# Patient Record
Sex: Female | Born: 1975 | Race: White | Hispanic: No | Smoking: Never smoker
Health system: Southern US, Community
[De-identification: ages and names within clinical notes are randomized; demographics above are authoritative.]

## PROBLEM LIST (undated history)

## (undated) DIAGNOSIS — Z8619 Personal history of other infectious and parasitic diseases: Secondary | ICD-10-CM

## (undated) DIAGNOSIS — M722 Plantar fascial fibromatosis: Secondary | ICD-10-CM

## (undated) DIAGNOSIS — D1800 Hemangioma unspecified site: Secondary | ICD-10-CM

## (undated) HISTORY — DX: Hemangioma unspecified site: D18.00

## (undated) HISTORY — DX: Personal history of other infectious and parasitic diseases: Z86.19

## (undated) HISTORY — PX: OTHER SURGICAL HISTORY: SHX169

## (undated) HISTORY — DX: Plantar fascial fibromatosis: M72.2

---

## 2004-09-19 ENCOUNTER — Ambulatory Visit: Payer: Self-pay | Admitting: Internal Medicine

## 2004-10-11 ENCOUNTER — Ambulatory Visit: Payer: Self-pay

## 2005-05-17 ENCOUNTER — Other Ambulatory Visit: Admission: RE | Admit: 2005-05-17 | Discharge: 2005-05-17 | Payer: Self-pay | Admitting: *Deleted

## 2005-08-21 ENCOUNTER — Emergency Department (HOSPITAL_COMMUNITY): Admission: EM | Admit: 2005-08-21 | Discharge: 2005-08-21 | Payer: Self-pay | Admitting: *Deleted

## 2005-10-20 ENCOUNTER — Encounter: Admission: RE | Admit: 2005-10-20 | Discharge: 2006-01-18 | Payer: Self-pay | Admitting: Nurse Practitioner

## 2008-04-07 ENCOUNTER — Other Ambulatory Visit: Admission: RE | Admit: 2008-04-07 | Discharge: 2008-04-07 | Payer: Self-pay | Admitting: Family Medicine

## 2008-06-25 ENCOUNTER — Ambulatory Visit: Payer: Self-pay | Admitting: Gynecology

## 2008-07-09 ENCOUNTER — Ambulatory Visit: Payer: Self-pay | Admitting: Gynecology

## 2008-07-24 ENCOUNTER — Ambulatory Visit: Payer: Self-pay | Admitting: Gynecology

## 2008-07-30 ENCOUNTER — Ambulatory Visit: Payer: Self-pay | Admitting: Gynecology

## 2008-07-30 ENCOUNTER — Ambulatory Visit (HOSPITAL_BASED_OUTPATIENT_CLINIC_OR_DEPARTMENT_OTHER): Admission: RE | Admit: 2008-07-30 | Discharge: 2008-07-30 | Payer: Self-pay | Admitting: Gynecology

## 2008-07-30 ENCOUNTER — Encounter: Payer: Self-pay | Admitting: Gynecology

## 2008-08-04 ENCOUNTER — Ambulatory Visit: Payer: Self-pay | Admitting: Gynecology

## 2008-08-10 ENCOUNTER — Ambulatory Visit: Payer: Self-pay | Admitting: Gynecology

## 2008-11-05 ENCOUNTER — Ambulatory Visit: Payer: Self-pay | Admitting: Gynecology

## 2009-04-01 ENCOUNTER — Ambulatory Visit: Payer: Self-pay | Admitting: Gynecology

## 2009-05-06 ENCOUNTER — Ambulatory Visit: Payer: Self-pay | Admitting: Gynecology

## 2009-05-06 ENCOUNTER — Other Ambulatory Visit: Admission: RE | Admit: 2009-05-06 | Discharge: 2009-05-06 | Payer: Self-pay | Admitting: Gynecology

## 2009-05-06 ENCOUNTER — Encounter: Payer: Self-pay | Admitting: Gynecology

## 2009-11-04 ENCOUNTER — Ambulatory Visit: Payer: Self-pay | Admitting: Gynecology

## 2009-11-18 ENCOUNTER — Ambulatory Visit: Payer: Self-pay | Admitting: Gynecology

## 2010-01-31 ENCOUNTER — Ambulatory Visit: Payer: Self-pay | Admitting: Gynecology

## 2010-05-19 ENCOUNTER — Other Ambulatory Visit: Admission: RE | Admit: 2010-05-19 | Discharge: 2010-05-19 | Payer: Self-pay | Admitting: Gynecology

## 2010-05-19 ENCOUNTER — Ambulatory Visit: Payer: Self-pay | Admitting: Gynecology

## 2010-07-29 ENCOUNTER — Ambulatory Visit: Payer: Self-pay | Admitting: Gynecology

## 2011-02-28 NOTE — H&P (Signed)
NAME:  Laurie Duffy, Laurie Duffy              ACCOUNT NO.:  [id number]   MEDICAL RECORD NO.:  [id number]          PATIENT TYPE:  AMB   LOCATION:  [facility]                         FACILITY:  [facility]   PHYSICIAN:  Timothy P. Fontaine, M.D.DATE OF BIRTH:  10/14/1976   DATE OF ADMISSION:  DATE OF DISCHARGE:                              HISTORY & PHYSICAL   DATE OF SURGERY:  15 October [facility].   PREOPERATIVE DIAGNOSES:  1. Left-sided abdominopelvic pain.  2. Left-sided ovarian cyst suspicious for endometrioma.  3. Endometrial polyp.  4. Vaginal mass.   HISTORY OF PRESENT ILLNESS:  A 35 year old G0 initially presented having  had a cystic area noted in the vagina on routine exam.  The patient had  a 1-2 cm fleshy area at the vaginal fornix on the left of the cervix.  She underwent subsequent ultrasound which showed this area which looked  to be a cystic solid area but also had a 33 mm homogeneously cystic mass  on the left ovary.  She subsequently notes that she has been having left-  sided pain.  It comes and goes particularly  with moving and straining.  On serial ultrasound this cystic area on the ovary has persisted as well  as this vaginal mass.  She was noted also on the initial ultrasound to  have a thickened endometrium, and on follow-up sonohistogram was found  to have a clear endometrial polyp.  She is admitted at this time for  laparoscopy, assessment of the left ovarian cyst, rule out endometriosis  for her pelvic pain, hysteroscopy with removal of the polyp, D and C as  well as assessment of the left vaginal mass with attempts at drainage  and/or sampling possible excision.   PAST MEDICAL HISTORY:  Significant for asthma.   PAST SURGICAL HISTORY:  Cyst removed from her back.   CURRENT MEDICATIONS:  Advair, Proventil, and Claritin p.r.n.   ALLERGIES:  No medications.   REVIEW OF SYSTEMS:  Noncontributory.   FAMILY HISTORY:  Noncontributory.   SOCIAL HISTORY:   Noncontributory.   ADMISSION PHYSICAL EXAMINATION:  Afebrile, vital signs are stable.  HEENT:  Normal.  LUNGS:  Clear.  CARDIAC:  Regular rate.  No rubs, murmurs or gallops.  ABDOMEN:  Benign.  PELVIC:  External BUS, vagina with a fleshy 2 cm mass upper left vagina,  nontender.  No overlying mucosal changes.  Bimanual uterus normal size,  midline mobile, nontender.  Adnexa without gross masses or tenderness.   ASSESSMENT:  A 35 year old G0.  Three separate issues:   1. Left-sided pelvic pain with ultrasound showing a persistent      homogeneous 33 mm mass on the ovary suggestive of an endometrioma      for laparoscopy and removal.  2. Endometrial polyp for hysteroscopy removal, dilatation and      curettage.  3. Fleshy mass upper vagina, possible Gartner duct cyst or      developmental remnant for sampling, possible excision.    I reviewed the proposed surgery with the patient to include the  expected intraoperative postoperative courses, and I reviewed the  individual  each part of the procedure and the inherent issues with each  part of the procedures.  I reviewed laparoscopy to include trocar  placement, multiple port sites, insufflation, the use of sharp blunt  dissection, laser electrocautery, harmonic scalpel.  She understands  there are no guarantees as far as her left-sided pain.  She is concerned  that it might be non-gynecological.  She wanted to have a general  surgeon present.  I reviewed with her that really everything points  towards endometriosis.  I will assess for other pathology.  If I find  other pathology I will summon a general surgeon, but at this point I  think the working diagnosis is endometriosis.  If her pain would persist  or change after the procedure that we would pursue a more involved  evaluation that may include MRI, CTs or referrals to other  subspecialties and she understands and is comfortable with this  approach.  I discussed the acute  intraoperative, postoperative courses  and possibilities to remove the cyst, possible removal of the ovary and  addressing other pathology as encountered.  No guarantees as far as pain  relief were made.  She understands her pain may persist, worsen or  change following the procedure.  The risks to include infection,  prolonged antibiotics, abscess formation requiring opening and draining  of abscesses, incisional complications requiring opening and draining of  incisions, closure by secondary intention, long-term issues as far as  cosmetics, scar formation, and hernia formation were all reviewed with  her.  The risks of hemorrhage necessitating transfusion and the risks of  transfusion including transfusion reaction, hepatitis, human  immunodeficiency virus, mad cow disease, and other unknown entities were  reviewed with her.  The risks of inadvertent injury to internal organs  either during the hysteroscopic part with uterine perforation or  laparoscopic part including bowel, bladder, ureters, vessels, and nerves  necessitating major exploratory reparative surgeries, future reparative  surgeries, ostomy formation, bowel resection, bladder repair, ureteral  damage repair were all discussed, understood, and accepted.  I reviewed  the issues with the vaginal mass; that if this was the only abnormality  and she is asymptomatic this probably is a developmental remnant that we  probably would observe; that this is an area where bladder, ureters,  large blood vessels come together.  I will at least attempt aspiration,  possible small incision to sample this area, but I will not be overly  aggressive to excise it to avoid complications as again she is  asymptomatic at this point.  The patient also agrees with this approach,  and she understands that she may continue to have this area after the  surgery and accepts this possibility.  All this has been reviewed with  the patient on several  occasions as evidenced by my office note  June 25, 2008 and again July 09, 2008.  The patient's  questions as well as her family members' questions were all answered to  her satisfaction.  She is ready to proceed with surgery.      Timothy P. Fontaine, M.D.  Electronically Signed     TPF/MEDQ  D:  07/09/2008  T:  07/09/2008  Job:  [id number]

## 2011-02-28 NOTE — Op Note (Signed)
NAME:  Laurie Duffy, BRICKEL              ACCOUNT NO.:  [id number]   MEDICAL RECORD NO.:  [id number]          PATIENT TYPE:  AMB   LOCATION:  [facility]                         FACILITY:  [facility]   PHYSICIAN:  Timothy P. Fontaine, M.D.DATE OF BIRTH:  21-Apr-1976   DATE OF PROCEDURE:  07/30/2008  DATE OF DISCHARGE:                               OPERATIVE REPORT   PREOPERATIVE DIAGNOSES:  Left-sided pelvic pain, left ovarian cyst,  endometrial polyp, vaginal cystic mass.   POSTOPERATIVE DIAGNOSES:  Endometrioma, left ovary, pelvic adhesions,  posterior cul-de-sac obliteration, endometrial polyp, vaginal cyst.   PROCEDURE:  Laparoscopic left ovarian endometrioma excision, lysis of  adhesions, fulguration endometriosis.  Hysteroscopy with removal  endometrial polyp, dilation and curettage, biopsy and drainage of left  vaginal wall cyst.   SURGEON:  Timothy P. Fontaine, M.D.   ASSISTANTGaetano Hawthorne. Lily Peer, M.D.   ANESTHETIC:  General with 1% lidocaine paracervical block, 0.25%  Marcaine incisional injection.   COMPLICATIONS:  None.   ESTIMATED BLOOD LOSS:  Minimal.   SORBITOL DISCREPANCY:  Minimal.   SPECIMENS:  1. Opening cell washing.  2. Left ovarian endometrioma.  3. Right paratubal cyst with adhesion.  4. Endometrial polyp.  5. Endometrial curetting.  6. Vaginal wall cyst biopsy.   FINDINGS:  EUA, external BUS vagina with 2 cm lateral left fornix,  vaginal cyst, cervix grossly normal.  Bimanual, uterus normal size,  midline and mobile.  Adnexa without gross masses, laparoscopic anterior  cul-de-sac normal.  Posterior cul-de-sac obliterated with sigmoid  adherent to the vagina and lower uterine segment.  Right fallopian tube  normal length and caliber of fimbriated end with a adhesive band with  simple paratubal type cyst and band going across to the lower posterior  uterine segment excised in its entirety.  Ovary grossly normal with the  exception of a small powder burn capsular  area which was fulgurated free  and mobile.  No sidewall disease noted.  Left ovary grossly enlarged  with cystic mass ultimately excised in its entirety and entered with  endometriotic classic fluid.  Cyst wall excised in its entirety.  Left  fallopian tube normal length and caliber of fimbriated end.  Upper  abdominal exam, appendix grossly normal free and mobile.  Liver smooth,  no abnormalities.  Gallbladder grossly normal.  No upper abdominal  adhesive disease or overt pathology.  Hysteroscopic, classic endometrial  polyp from left posterior endometrial wall excised at its base.  Hysteroscopy was otherwise normal and adequate noting right and left  tubal ostia.  Fundus, anterior and posterior uterine surfaces, lower  uterine segment, endocervical canal all visualized   PROCEDURE:  The patient was taken to the operating room, underwent  general anesthesia was placed in low dorsal lithotomy position, received  abdominal perineal vaginal preparation with Betadine solution.  Bladder  emptied with indwelling Foley catheterization.  Draped in usual fashion.  EUA performed.  A Hulka tenaculum was placed in the cervix.  Subsequently, a vertical infraumbilical incision was made using the 10-  mm OptiView direct entry trocar.  The abdomen was directly entered  without difficulty and subsequently insufflated.  Right and left 5-mm  suprapubic ports were then placed under direct visualization after  transillumination for the vessels.  Examination of pelvic organs, upper  abdominal exam was carried out with findings noted above.  Opening cell  washing was then taken and sent to pathology.  Subsequently, the right  peritubular cyst with adhesive band was excised and sent to pathology.  A small classic brown powder burn type lesion on the right ovarian  capsule was then fulgurated.  Attention was then turned to the left  ovarian cystic area.  This was elevated and using the Harmonic scalpel   incision made overlying the cyst through the capsule and through sharp  and blunt dissection the cyst wall was extracted from the surrounding  ovarian stroma.  During the process frank endometriotic fluid extruded  from this cyst.  After removal of this cyst wall in its entirety the  pelvis was copiously irrigated.  Several bleeding areas were addressed  using bipolar cautery.  It was felt at this point that due to the thick  adhesions of the sigmoid to the posterior lower uterine segment, upper  vagina that it really served no purpose at this point to try to free  this area given that it would more than likely immediately re-form, as  well as risk entry into the colon.  Interceed was then placed over the  left adnexa.  The gas was slowly allowed to escape.  The adnexa  visualized at showing adequate hemostasis under low pressure situation.  The right and left 5-mm ports were then removed again, showing adequate  hemostasis and the infraumbilical port was backed out under direct  visualization showing adequate hemostasis.  No evidence of hernia  formation.  All skin incisions injected using 0.25% Marcaine.  Zero  Vicryl infraumbilical subcutaneous fascial stitch was placed and all  skin incisions closed using Dermabond skin adhesive.  Attention was then  turned to the hysteroscopic portion of the case.  The patient was placed  in the high dorsal lithotomy position.  Re-gloved, cervix visualized  with a speculum, grasped anterior lip with a single-tooth tenaculum and  a paracervical block using 1% lidocaine was placed a total of 10 mL.  The cervix was gently gradually dilated to admit the operative  hysteroscope.  Hysteroscopy was performed with findings noted above.  Using the right-angle resectoscope loop the polyp was excised at its  base at the junction with the surrounding endometrium then removed  intact without difficulty and avoiding the tubal ostia.  A gentle sharp  curettage was  then performed.  This specimen was sent separately to  pathology.  Re-hysteroscopy showed an empty cavity, good distention, no  evidence of perforation.  The tenaculum was then removed.  Attention was  then turned to the vaginal cyst.  This was bulging into the vagina.  It  appeared superficially to be a cystic mass and using a large colposcopic  biopsy instrument the vaginal mucosa overlying the attenuated area was  biopsied into this cystic area.  A biopsy of both the cyst wall and  overlying vagina was incorporated in the same biopsy and this was sent  to pathology.  There was a thick tenacious mucus that drained from the  biopsy site, draining the cyst in its entirety.  Silver nitrate was  applied to the vaginal biopsy mucosa to achieve ultimate hemostasis.  The instruments were then removed.  The patient was placed in the supine  position, awakened without difficulty and taken to  the recovery room in  good condition having tolerated the procedure well and receiving Toradol  intraoperatively.      Timothy P. Fontaine, M.D.  Electronically Signed     TPF/MEDQ  D:  07/30/2008  T:  07/30/2008  Job:  [id number]

## 2011-05-17 ENCOUNTER — Other Ambulatory Visit: Payer: Self-pay | Admitting: *Deleted

## 2011-05-17 MED ORDER — NORETHINDRONE ACET-ETHINYL EST 1-20 MG-MCG PO TABS: 1.0000 | ORAL_TABLET | Freq: Every day | ORAL | Status: DC

## 2011-06-01 ENCOUNTER — Other Ambulatory Visit (HOSPITAL_COMMUNITY)
Admission: RE | Admit: 2011-06-01 | Discharge: 2011-06-01 | Disposition: A | Discharge status: Home / Self Care | Payer: 59 | Source: Ambulatory Visit | Attending: Gynecology | Admitting: Gynecology

## 2011-06-01 ENCOUNTER — Ambulatory Visit (INDEPENDENT_AMBULATORY_CARE_PROVIDER_SITE_OTHER): Payer: 59 | Admitting: Gynecology

## 2011-06-01 ENCOUNTER — Encounter: Payer: Self-pay | Admitting: Gynecology

## 2011-06-01 VITALS — BP 116/72 | Ht 66.0 in | Wt 153.0 lb

## 2011-06-01 DIAGNOSIS — J45909 Unspecified asthma, uncomplicated: Secondary | ICD-10-CM | POA: Insufficient documentation

## 2011-06-01 DIAGNOSIS — Z131 Encounter for screening for diabetes mellitus: Secondary | ICD-10-CM

## 2011-06-01 DIAGNOSIS — Z01419 Encounter for gynecological examination (general) (routine) without abnormal findings: Secondary | ICD-10-CM | POA: Insufficient documentation

## 2011-06-01 DIAGNOSIS — Z3041 Encounter for surveillance of contraceptive pills: Secondary | ICD-10-CM

## 2011-06-01 DIAGNOSIS — R823 Hemoglobinuria: Secondary | ICD-10-CM

## 2011-06-01 DIAGNOSIS — Z13220 Encounter for screening for lipoid disorders: Secondary | ICD-10-CM

## 2011-06-01 DIAGNOSIS — N809 Endometriosis, unspecified: Secondary | ICD-10-CM | POA: Insufficient documentation

## 2011-06-01 MED ORDER — NORETHINDRONE ACET-ETHINYL EST 1-20 MG-MCG PO TABS: 1.0000 | ORAL_TABLET | Freq: Every day | ORAL | Status: DC

## 2011-06-01 NOTE — Progress Notes (Signed)
Laurie Duffy 31-Jul-1976 [id number]        35 y.o.  for annual exam.   Past medical history,surgical history, allergies, family history and social history were all reviewed and documented in the EPIC chart. ROS:  Was performed and pertinent positives and negatives are included in the history.  Exam: chaperone present Filed Vitals:   06/01/11 1457  BP: 116/72   General appearance  Normal Skin grossly normal with hemangioma on the right anterior chest wall right below the midclavicular line  Head/Neck normal with no cervical or supraclavicular adenopathy thyroid normal Lungs  clear Cardiac RR, without RMG Abdominal  soft, nontender, without masses, organomegaly or hernia Breasts  examined lying and sitting without masses, retractions, discharge or axillary adenopathy. Pelvic  Ext/BUS/vagina  normal   Cervix  normal  Pap done  Uterus  anteverted, normal size, shape and contour, midline and mobile nontender   Adnexa  Without masses or tenderness    Anus and perineum  normal   Rectovaginal  normal sphincter tone without palpated masses or tenderness.    Assessment/Plan:  35 y.o. female for annual exam.    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 She saw a dermatologist who recommended observation did not feel intervention would be warranted at this time. Patient will continue to monitor. #2 Pregnancy questions. Patient and husband are thinking about pregnancy and she asked me about this. I discussed with her the need to be off of birth control pills for a month before trying and the need for preconceptional multivitamin supplementation with folic acid. I discussed increased risk of pregnancy with advancing maternal age to include increased risk for metabolic complications such as hypertension diabetes, increased risk for chromosomal abnormalities as well as the decreased fecundity with advancing  maternal age.  She also has a history of endometriosis and possible infertility associated with this was discussed.  Patient and husband are just thinking about it she does want to continue on her birth control pills for now and I refilled her Junel 1/20 prescription for a year. Self breast exams on a monthly basis discussed urged. Screening mammographic recommendations between 35 and 40 were reviewed she has no strong family history prefers to wait closer to 40.    Dara Lords MD, 4:03 PM 06/01/2011

## 2012-06-03 ENCOUNTER — Ambulatory Visit (INDEPENDENT_AMBULATORY_CARE_PROVIDER_SITE_OTHER): Payer: 59 | Admitting: Gynecology

## 2012-06-03 ENCOUNTER — Encounter: Payer: Self-pay | Admitting: Gynecology

## 2012-06-03 VITALS — BP 110/66 | Ht 66.0 in | Wt 158.0 lb

## 2012-06-03 DIAGNOSIS — Z01419 Encounter for gynecological examination (general) (routine) without abnormal findings: Secondary | ICD-10-CM

## 2012-06-03 DIAGNOSIS — Z8619 Personal history of other infectious and parasitic diseases: Secondary | ICD-10-CM | POA: Insufficient documentation

## 2012-06-03 NOTE — Progress Notes (Signed)
Laurie Duffy [birth year] [id number]        36 y.o.  G0P0 for annual exam.  Doing well, planning pregnancy.  Past medical history,surgical history, medications, allergies, family history and social history were all reviewed and documented in the EPIC chart. ROS:  Was performed and pertinent positives and negatives are included in the history.  Exam: Kim assistant Filed Vitals:   06/03/12 0849  BP: 110/66  Height: 5\' 6"  (1.676 m)  Weight: 158 lb (71.668 kg)   General appearance  Normal Skin grossly normal Head/Neck normal with no cervical or supraclavicular adenopathy thyroid normal Lungs  Clear. A 3 cm hemangioma right upper anterior chest wall. Cardiac RR, without RMG Abdominal  soft, nontender, without masses, organomegaly or hernia Breasts  examined lying and sitting without masses, retractions, discharge or axillary adenopathy. Pelvic  Ext/BUS/vagina  normal   Cervix  normal   Uterus  retroverted, normal size, shape and contour, midline and mobile nontender   Adnexa  Without masses or tenderness    Anus and perineum  normal   Rectovaginal  normal sphincter tone without palpated masses or tenderness.    Assessment/Plan:  36 y.o. G0P0 female for annual exam.   1. Planning pregnancy now. On a prenatal vitamin. Discussed optimal time to achieve pregnancy. Recommended stop using facial cream as the possibility of vitamin A content. Reviewed increased risk of chromosomal abnormalities associated with advancing maternal age and the availability of prenatal diagnostic techniques to include serum, ultrasound, CVS, amniocentesis.  Patient understands obstetrical group will further discuss with her. She understands that we do not do obstetrics and once she achieves pregnancy she'll need to be followed by and obstetrical group in [place]. I reviewed various possibilities with her and she is going to make her decision.  Options to follow up here once she achieves pregnancy for viability  confirmation before following up with the obstetrical group or going directly to them reviewed. 2. Hemangioma right upper chest wall. Stable over years observation. I reviewed possibility of enlargement during pregnancy and the obstetricians we'll need to follow this with her. 3. Pap smear. Last Pap smear 2011. No Pap smear done today. Anticipate will be done at her new OB appointment. 4. Lab work. No blood work was done today as she had a normal glucose lipid profile and CBC 2011. She will have lab work done at her new OB appointment. 5. Breast health. SBE monthly reviewed.   Dara Lords MD, 9:19 AM 06/03/2012

## 2012-06-03 NOTE — Patient Instructions (Signed)
Follow up when you achieve pregnancy for viability verification. Research obstetrical groups in [place] as you will need to make an appointment for continuing obstetrical care

## 2012-06-04 ENCOUNTER — Encounter: Payer: Self-pay | Admitting: Gynecology

## 2012-06-04 LAB — URINALYSIS W MICROSCOPIC + REFLEX CULTURE
Bilirubin Urine: NEGATIVE
Crystals: NONE SEEN
Glucose, UA: NEGATIVE mg/dL
Leukocytes, UA: NEGATIVE
Protein, ur: NEGATIVE mg/dL
Specific Gravity, Urine: 1.009 (ref 1.005–1.030)
Squamous Epithelial / LPF: NONE SEEN
Urobilinogen, UA: 0.2 mg/dL (ref 0.0–1.0)

## 2012-07-05 ENCOUNTER — Telehealth: Payer: Self-pay | Admitting: *Deleted

## 2012-07-05 DIAGNOSIS — O3680X0 Pregnancy with inconclusive fetal viability, not applicable or unspecified: Secondary | ICD-10-CM

## 2012-07-05 NOTE — Telephone Encounter (Signed)
Pt informed and wants an [place] so will schedule KW

## 2012-07-05 NOTE — Telephone Encounter (Signed)
Ok to go directly to [facility] group.  Only reason to f/u here would be if she wants ultrasound to confirm viability before seeing another group.  Some patients prefer if early miscarriage, to be taken care of by [place] before seeing a group she has never met before.  If she does want to see [place] first, she can schedule sono outright and see me same day.

## 2012-07-05 NOTE — Telephone Encounter (Signed)
Pt had a positive preg test and confirmed by PCP. She has an apt scheduled with you Tuesday 9/24. Since confirmed by another dr she doesn't need to come here right? Do you need her to make another apt or just give names of OB drs? PLease advise?

## 2012-07-09 ENCOUNTER — Ambulatory Visit (INDEPENDENT_AMBULATORY_CARE_PROVIDER_SITE_OTHER): Payer: 59 | Admitting: Gynecology

## 2012-07-09 ENCOUNTER — Encounter: Payer: Self-pay | Admitting: Gynecology

## 2012-07-09 ENCOUNTER — Ambulatory Visit (INDEPENDENT_AMBULATORY_CARE_PROVIDER_SITE_OTHER): Payer: 59

## 2012-07-09 ENCOUNTER — Ambulatory Visit: Payer: 59 | Admitting: Gynecology

## 2012-07-09 DIAGNOSIS — O3680X0 Pregnancy with inconclusive fetal viability, not applicable or unspecified: Secondary | ICD-10-CM

## 2012-07-09 DIAGNOSIS — N912 Amenorrhea, unspecified: Secondary | ICD-10-CM

## 2012-07-09 DIAGNOSIS — O09519 Supervision of elderly primigravida, unspecified trimester: Secondary | ICD-10-CM

## 2012-07-09 LAB — US OB TRANSVAGINAL

## 2012-07-09 NOTE — Progress Notes (Signed)
The patient presents for ultrasound viability check. Doing well without bleeding or cramping.  Ultrasound shows viable IUP at 7 weeks 3 days consistent with her LMP.  Assessment and plan: Pregnancy at 7 weeks. Patient will make an appointment with an obstetrician in town for continued obstetrical care. She is on prenatal vitamin and will continue on this. General prenatal questions answered. Recommended that she be seen for her new OB appointment within the next 2-3 weeks to allow for discussion of prenatal testing options.

## 2012-07-09 NOTE — Patient Instructions (Signed)
Make a new OB appointment with an obstetrician in town within the next 2-3 weeks.

## 2012-07-22 ENCOUNTER — Encounter: Payer: Self-pay | Admitting: Gynecology

## 2012-07-22 ENCOUNTER — Ambulatory Visit (INDEPENDENT_AMBULATORY_CARE_PROVIDER_SITE_OTHER): Payer: 59

## 2012-07-22 ENCOUNTER — Ambulatory Visit (HOSPITAL_COMMUNITY)
Admit: 2012-07-22 | Discharge: 2012-07-22 | Disposition: A | Discharge status: Home / Self Care | Payer: 59 | Source: Ambulatory Visit | Attending: Gynecology | Admitting: Gynecology

## 2012-07-22 ENCOUNTER — Ambulatory Visit (INDEPENDENT_AMBULATORY_CARE_PROVIDER_SITE_OTHER): Payer: 59 | Admitting: Gynecology

## 2012-07-22 ENCOUNTER — Encounter (HOSPITAL_BASED_OUTPATIENT_CLINIC_OR_DEPARTMENT_OTHER): Payer: Self-pay | Admitting: *Deleted

## 2012-07-22 VITALS — Temp 98.3°F

## 2012-07-22 DIAGNOSIS — O200 Threatened abortion: Secondary | ICD-10-CM

## 2012-07-22 DIAGNOSIS — O021 Missed abortion: Secondary | ICD-10-CM

## 2012-07-22 LAB — CBC WITH DIFFERENTIAL/PLATELET
Hemoglobin: 13.6 g/dL (ref 12.0–15.0)
Lymphocytes Relative: 30 % (ref 12–46)
Lymphs Abs: 1.7 10*3/uL (ref 0.7–4.0)
Monocytes Relative: 11 % (ref 3–12)
Neutro Abs: 3.2 10*3/uL (ref 1.7–7.7)
Neutrophils Relative %: 57 % (ref 43–77)
Platelets: 241 10*3/uL (ref 150–400)
RBC: 4.2 MIL/uL (ref 3.87–5.11)
WBC: 5.6 10*3/uL (ref 4.0–10.5)

## 2012-07-22 LAB — US OB TRANSVAGINAL

## 2012-07-22 LAB — ABO AND RH: Rh Type: POSITIVE

## 2012-07-22 NOTE — Addendum Note (Signed)
Addended by: Dara Lords on: 07/22/2012 02:23 PM   Modules accepted: Orders

## 2012-07-22 NOTE — Progress Notes (Addendum)
Patient presents at 9 weeks with spotting. She awoke this morning feeling a little warm although did not take her temperature. No coughing sneezing congestion or other viral URI symptoms. No nausea vomiting diarrhea constipation. Mild cramping. Previous ultrasound 07/05/2012 should viable IUP.  Exam with Selena Batten assistant HEENT normal without evidence of adenopathy or pharyngitis. Lungs clear Cardiac regular rate no rubs murmurs or gallops Abdomen soft nontender without masses guarding rebound organomegaly Pelvic external BUS vagina with dark brown spotting. Cervix long enclosed. Uterus 9 weeks soft midline mobile nontender.  Assessment and plan: 1. Threatened AB. Check ultrasound now. Patient does have an appointment to see her obstetrician is coming week. If viability confirmed and follow up with obstetrician with ASAP call precautions reviewed. If otherwise then we'll discuss following ultrasound. 2. Felt warm. Temperature today 98.3. No evidence of viral syndrome. Will monitor at present. Reviewed acceptable medicines to use early in pregnancy if symptoms develop such as Tylenol/Robitussin.  Addendum: Ultrasound subsequently shows missed AB with embryonic pole 8 weeks 3 days approximately one week behind dates with no cardiac motion. Reviewed situation with the patient and her husband. Options for management to include observation, Cytotec and suction D&C reviewed. The pros/cons, risks/benefits of each choice discussed. I reviewed what is involved with suction D&C to include the expected intraoperative/postoperative courses. The risks of bleeding, transfusion, infection, adverse effect on fertility, perforation with damage to internal organs including bowel bladder ureters vessels and nerves necessitating major exploratory reparative surgeries all reviewed with her. Expectant management/cytotec risks to include hemorrhage/infection/incomplete AB ultimately necessitating D&C regardless all reviewed.  Patient wants to proceed with suction D&C and will schedule this. We'll check CBC and type and Rh.

## 2012-07-22 NOTE — Addendum Note (Signed)
Addended by: Dara Lords on: 07/22/2012 02:32 PM   Modules accepted: Orders

## 2012-07-22 NOTE — Progress Notes (Signed)
NPO AFTER MN WITH EXCEPTION WATER/ GATORADE UNTIL 0700. ARRIVES AT 1145. LABS DONE AT OFFICE, RESULTS IN EPIC. PRE-OP ORDERS PENDING.

## 2012-07-22 NOTE — H&P (Signed)
  TYANA BUTZER 08/07/1976 [id number]   History and Physical  Chief complaint: missed abortion, 8-9 weeks  History of present illness: 36 y.o. G1P0 presents at 8-9 weeks complaining of spotting and mild pelvic cramping. Patient has previous ultrasound showing viable IUP with cardiac activity with follow up ultrasound showing no cardiac activity consistent with missed AB. Patient is admitted for suction D&C.  Past medical history,surgical history, medications, allergies, family history and social history were all reviewed and documented in the EPIC chart. ROS:  Was performed and pertinent positives and negatives are included in the history of present illness.  Exam:  Kim assistant General: well developed, well nourished female, no acute distress HEENT: normal  Lungs: clear to auscultation without wheezing, rales or rhonchi  Cardiac: regular rate without rubs, murmurs or gallops  Abdomen: soft, nontender without masses, guarding, rebound, organomegaly  Pelvic: external bus vagina: normal with mild brown staining Cervix: long and closed Uterus: 8 weeks' soft nontender midline mobile  Adnexa: without masses or tenderness      Assessment/Plan:  36 y.o. G1P0 at 8-9 weeks with missed abortion.  Options for management to include observation, Cytotec and suction D&C reviewed. The pros/cons, risks/benefits of each choice discussed. I reviewed what is involved with suction D&C to include the expected intraoperative/postoperative courses. The risks of bleeding, transfusion, infection, adverse effect on fertility, perforation with damage to internal organs including bowel bladder ureters vessels and nerves necessitating major exploratory reparative surgeries all reviewed with her. Expectant management/cytotec risks to include hemorrhage/infection/incomplete AB ultimately necessitating D&C regardless all reviewed. Patient wants to proceed with suction D&C and will schedule this. We'll check CBC and type  and Rh.      Dara Lords MD, 5:35 PM 07/22/2012

## 2012-07-22 NOTE — Patient Instructions (Addendum)
Follow up for ultrasound as scheduled. Follow up for surgery as scheduled.

## 2012-07-23 ENCOUNTER — Encounter (HOSPITAL_BASED_OUTPATIENT_CLINIC_OR_DEPARTMENT_OTHER): Payer: Self-pay | Admitting: Anesthesiology

## 2012-07-23 ENCOUNTER — Encounter (HOSPITAL_BASED_OUTPATIENT_CLINIC_OR_DEPARTMENT_OTHER)
Admission: RE | Disposition: A | Discharge status: Home / Self Care | Payer: Self-pay | Source: Ambulatory Visit | Attending: Gynecology

## 2012-07-23 ENCOUNTER — Ambulatory Visit (HOSPITAL_BASED_OUTPATIENT_CLINIC_OR_DEPARTMENT_OTHER): Payer: 59 | Admitting: Anesthesiology

## 2012-07-23 ENCOUNTER — Ambulatory Visit (HOSPITAL_BASED_OUTPATIENT_CLINIC_OR_DEPARTMENT_OTHER)
Admission: RE | Admit: 2012-07-23 | Discharge: 2012-07-23 | Disposition: A | Discharge status: Home / Self Care | Payer: 59 | Source: Ambulatory Visit | Attending: Gynecology | Admitting: Gynecology

## 2012-07-23 ENCOUNTER — Encounter (HOSPITAL_BASED_OUTPATIENT_CLINIC_OR_DEPARTMENT_OTHER): Payer: Self-pay | Admitting: *Deleted

## 2012-07-23 DIAGNOSIS — O021 Missed abortion: Secondary | ICD-10-CM | POA: Insufficient documentation

## 2012-07-23 HISTORY — PX: DILATION AND EVACUATION: SHX1459

## 2012-07-23 SURGERY — DILATION AND EVACUATION, UTERUS
Anesthesia: General | Site: Uterus | Wound class: Clean Contaminated

## 2012-07-23 MED ORDER — PROPOFOL 10 MG/ML IV BOLUS
INTRAVENOUS | Status: DC | PRN
  Administered 2012-07-23: 170 mg via INTRAVENOUS

## 2012-07-23 MED ORDER — PROMETHAZINE HCL 25 MG/ML IJ SOLN: 6.2500 mg | INTRAMUSCULAR | Status: DC | PRN

## 2012-07-23 MED ORDER — LIDOCAINE HCL (CARDIAC) 20 MG/ML IV SOLN
INTRAVENOUS | Status: DC | PRN
  Administered 2012-07-23: 50 mg via INTRAVENOUS

## 2012-07-23 MED ORDER — DEXTROSE 5 % IV SOLN
2.0000 g | INTRAVENOUS | Status: AC
  Administered 2012-07-23: 1 g via INTRAVENOUS

## 2012-07-23 MED ORDER — LACTATED RINGERS IV SOLN: INTRAVENOUS | Status: DC

## 2012-07-23 MED ORDER — KETOROLAC TROMETHAMINE 30 MG/ML IJ SOLN
INTRAMUSCULAR | Status: DC | PRN
  Administered 2012-07-23: 30 mg via INTRAVENOUS

## 2012-07-23 MED ORDER — ALPRAZOLAM 0.5 MG PO TABS: 0.5000 mg | ORAL_TABLET | Freq: Every evening | ORAL | Status: DC | PRN

## 2012-07-23 MED ORDER — FENTANYL CITRATE 0.05 MG/ML IJ SOLN
INTRAMUSCULAR | Status: DC | PRN
  Administered 2012-07-23 (×2): 50 ug via INTRAVENOUS

## 2012-07-23 MED ORDER — MEPERIDINE HCL 25 MG/ML IJ SOLN: 6.2500 mg | INTRAMUSCULAR | Status: DC | PRN

## 2012-07-23 MED ORDER — FENTANYL CITRATE 0.05 MG/ML IJ SOLN: 25.0000 ug | INTRAMUSCULAR | Status: DC | PRN

## 2012-07-23 MED ORDER — DEXTROSE-NACL 5-0.9 % IV SOLN: INTRAVENOUS | Status: DC

## 2012-07-23 MED ORDER — ONDANSETRON HCL 4 MG/2ML IJ SOLN
INTRAMUSCULAR | Status: DC | PRN
  Administered 2012-07-23: 4 mg via INTRAVENOUS

## 2012-07-23 MED ORDER — LACTATED RINGERS IV SOLN
INTRAVENOUS | Status: DC
  Administered 2012-07-23: 12:00:00 via INTRAVENOUS

## 2012-07-23 MED ORDER — MIDAZOLAM HCL 5 MG/5ML IJ SOLN
INTRAMUSCULAR | Status: DC | PRN
  Administered 2012-07-23: 2 mg via INTRAVENOUS

## 2012-07-23 MED ORDER — DEXAMETHASONE SODIUM PHOSPHATE 4 MG/ML IJ SOLN
INTRAMUSCULAR | Status: DC | PRN
  Administered 2012-07-23: 10 mg via INTRAVENOUS

## 2012-07-23 MED ORDER — LIDOCAINE HCL 1 % IJ SOLN
INTRAMUSCULAR | Status: DC | PRN
  Administered 2012-07-23: 10 mL

## 2012-07-23 SURGICAL SUPPLY — 33 items
BAG URINE DRAINAGE (UROLOGICAL SUPPLIES) IMPLANT
CATH FOLEY 2WAY SLVR 30CC 16FR (CATHETERS) IMPLANT
CATH ROBINSON RED A/P 16FR (CATHETERS) ×2 IMPLANT
CLOTH BEACON ORANGE TIMEOUT ST (SAFETY) ×2 IMPLANT
COVER TABLE BACK 60X90 (DRAPES) ×2 IMPLANT
DRAPE LG THREE QUARTER DISP (DRAPES) ×2 IMPLANT
DRAPE UNDERBUTTOCKS STRL (DRAPE) ×2 IMPLANT
DRESSING TELFA 8X3 (GAUZE/BANDAGES/DRESSINGS) IMPLANT
GLOVE BIO SURGEON STRL SZ7.5 (GLOVE) ×4 IMPLANT
GOWN SURGICAL LARGE (GOWNS) ×1 IMPLANT
GOWN SURGICAL XLG (GOWNS) ×1 IMPLANT
GOWN W/2 COTTON TOWELS 2 STD (GOWNS) ×2 IMPLANT
KIT BERKELEY 1ST TRIMESTER 3/8 (MISCELLANEOUS) ×1 IMPLANT
LEGGING LITHOTOMY PAIR STRL (DRAPES) ×2 IMPLANT
NDL SPNL 22GX3.5 QUINCKE BK (NEEDLE) ×1 IMPLANT
NEEDLE SPNL 22GX3.5 QUINCKE BK (NEEDLE) ×2 IMPLANT
NS IRRIG 500ML POUR BTL (IV SOLUTION) ×1 IMPLANT
PACK BASIN DAY SURGERY FS (CUSTOM PROCEDURE TRAY) ×2 IMPLANT
PAD OB MATERNITY 4.3X12.25 (PERSONAL CARE ITEMS) ×2 IMPLANT
PAD PREP 24X48 CUFFED NSTRL (MISCELLANEOUS) ×2 IMPLANT
SCOPETTES 8  STERILE (MISCELLANEOUS)
SCOPETTES 8 STERILE (MISCELLANEOUS) IMPLANT
SET BERKELEY SUCTION TUBING (SUCTIONS) ×2 IMPLANT
SYR 30ML LL (SYRINGE) IMPLANT
SYR CONTROL 10ML LL (SYRINGE) ×1 IMPLANT
TOP DISP BERKELEY (MISCELLANEOUS) ×1 IMPLANT
TOWEL OR 17X24 6PK STRL BLUE (TOWEL DISPOSABLE) ×3 IMPLANT
TRAY DSU PREP LF (CUSTOM PROCEDURE TRAY) ×2 IMPLANT
VACURETTE 10 RIGID CVD (CANNULA) IMPLANT
VACURETTE 7MM CVD STRL WRAP (CANNULA) IMPLANT
VACURETTE 8 RIGID CVD (CANNULA) IMPLANT
VACURETTE 9 RIGID CVD (CANNULA) ×1 IMPLANT
WATER STERILE IRR 500ML POUR (IV SOLUTION) ×1 IMPLANT

## 2012-07-23 NOTE — H&P (Signed)
  The patient was examined.  I reviewed the proposed surgery and consent form with the patient.  The dictated history and physical is current and accurate and all questions were answered. The patient is ready to proceed with surgery and has a realistic understanding and expectation for the outcome.   Dara Lords MD, 1:00 PM 07/23/2012

## 2012-07-23 NOTE — Anesthesia Preprocedure Evaluation (Signed)
Anesthesia Evaluation  Patient identified by MRN, date of birth, ID band Patient awake    Reviewed: Allergy & Precautions, H&P , NPO status , Patient's Chart, lab work & pertinent test results  Airway Mallampati: II TM Distance: >3 FB Neck ROM: Full    Dental No notable dental hx.    Pulmonary neg pulmonary ROS, asthma ,  breath sounds clear to auscultation  Pulmonary exam normal       Cardiovascular negative cardio ROS  Rhythm:Regular Rate:Normal     Neuro/Psych negative neurological ROS  negative psych ROS   GI/Hepatic negative GI ROS, Neg liver ROS,   Endo/Other  negative endocrine ROS  Renal/GU negative Renal ROS  negative genitourinary   Musculoskeletal negative musculoskeletal ROS (+)   Abdominal   Peds negative pediatric ROS (+)  Hematology negative hematology ROS (+)   Anesthesia Other Findings   Reproductive/Obstetrics negative OB ROS                           Anesthesia Physical Anesthesia Plan  ASA: II  Anesthesia Plan: General   Post-op Pain Management:    Induction: Intravenous  Airway Management Planned: LMA  Additional Equipment:   Intra-op Plan:   Post-operative Plan: Extubation in OR  Informed Consent: I have reviewed the patients History and Physical, chart, labs and discussed the procedure including the risks, benefits and alternatives for the proposed anesthesia with the patient or authorized representative who has indicated his/her understanding and acceptance.   Dental advisory given  Plan Discussed with: CRNA  Anesthesia Plan Comments:         Anesthesia Quick Evaluation  

## 2012-07-23 NOTE — Anesthesia Procedure Notes (Addendum)
Procedure Name: LMA Insertion Date/Time: 07/23/2012 1:09 PM Performed by: Maris Berger T Pre-anesthesia Checklist: Patient identified, Emergency Drugs available, Suction available and Patient being monitored Patient Re-evaluated:Patient Re-evaluated prior to inductionOxygen Delivery Method: Circle System Utilized Preoxygenation: Pre-oxygenation with 100% oxygen Intubation Type: IV induction Ventilation: Mask ventilation without difficulty LMA: LMA inserted LMA Size: 4.0 Number of attempts: 1 Placement Confirmation: positive ETCO2 Dental Injury: Teeth and Oropharynx as per pre-operative assessment  Comments: Gauze roll between teeth

## 2012-07-23 NOTE — Op Note (Signed)
Laurie Duffy 1976-05-06 [id number]   Post Operative Note   Date of surgery:  07/23/2012  Pre Op Dx:  Missed abortion, 8 weeks'  Post Op Dx:  same  Procedure:  Suction D&C  Surgeon:  Dara Lords  Anesthesia:  General  EBL:  minimal  Complications:  None  Specimen:  POC to pathology  Findings: EUA:  External BUS vagina normal. Cervix closed with bloody mucoid discharge.  Uterus 8 weeks size midline soft. Adnexa without masses  Procedure:  The patient was taken to the operating room, underwent general anesthesia, was placed in a low dorsal lithotomy position, received a vaginal/perineal preparation with Betadine solution, bladder emptied with an in and out Foley catheterization and EUA was performed. The time out was performed by the surgical team. The cervix was visualized with a speculum, anterior lip grasped with a single-tooth tenaculum and a paracervical block using 1% lidocaine was placed a total of 10 cc. The cervix was gently and gradually dilated to admit the #9 suction curet and the suction curettage was performed with gross POC retrieved. A gentle sharp curettage was performed to assure complete emptying. The tenaculum was removed with adequate hemostasis visualized at the tenaculum site and external os. The patient received intraoperative Toradol, was awakened without difficulty and taken to the recovery room in good condition having tolerated the procedure well. The patient's blood type is A+.    Dara Lords MD, 1:26 PM 07/23/2012

## 2012-07-23 NOTE — Transfer of Care (Signed)
Immediate Anesthesia Transfer of Care Note  Patient: Laurie Duffy  Procedure(s) Performed: Procedure(s) (LRB) with comments: DILATATION AND EVACUATION (N/A)  Patient Location: PACU  Anesthesia Type: General  Level of Consciousness: sedated  Airway & Oxygen Therapy: Patient Spontanous Breathing and Patient connected to nasal cannula oxygen  Post-op Assessment: Report given to PACU RN  Post vital signs: Reviewed and stable  Complications: No apparent anesthesia complications

## 2012-07-24 ENCOUNTER — Encounter (HOSPITAL_BASED_OUTPATIENT_CLINIC_OR_DEPARTMENT_OTHER): Payer: Self-pay | Admitting: Gynecology

## 2012-07-24 NOTE — Anesthesia Postprocedure Evaluation (Signed)
  Anesthesia Post-op Note  Patient: Laurie Duffy  Procedure(s) Performed: Procedure(s) (LRB): DILATATION AND EVACUATION (N/A)  Patient Location: PACU  Anesthesia Type: General  Level of Consciousness: awake and alert   Airway and Oxygen Therapy: Patient Spontanous Breathing  Post-op Pain: mild  Post-op Assessment: Post-op Vital signs reviewed, Patient's Cardiovascular Status Stable, Respiratory Function Stable, Patent Airway and No signs of Nausea or vomiting  Post-op Vital Signs: stable  Complications: No apparent anesthesia complications

## 2012-07-31 ENCOUNTER — Telehealth: Payer: Self-pay | Admitting: *Deleted

## 2012-07-31 NOTE — Telephone Encounter (Signed)
Pt informed with the below note and follow as directed.

## 2012-07-31 NOTE — Telephone Encounter (Signed)
Does not sound unusual. As long as she is afebrile and the pain overall is getting better then I would watch and follow up at her postop appointment as scheduled.

## 2012-07-31 NOTE — Telephone Encounter (Signed)
Pt had D&C on 07/23/12, she is having sharp left pelvic pain off and on at times. Feeling bloated at times as well, trouble sleeping at night, only getting about 4 hours of sleep a night. No issues with bleeding, just light pain, she is taking the ibuprofen given. Pt asked if normal for 1 week post -op? Please advise

## 2012-08-05 ENCOUNTER — Encounter: Payer: Self-pay | Admitting: Gynecology

## 2012-08-05 ENCOUNTER — Ambulatory Visit (INDEPENDENT_AMBULATORY_CARE_PROVIDER_SITE_OTHER): Payer: 59 | Admitting: Gynecology

## 2012-08-05 DIAGNOSIS — Z9889 Other specified postprocedural states: Secondary | ICD-10-CM

## 2012-08-05 DIAGNOSIS — M545 Low back pain: Secondary | ICD-10-CM

## 2012-08-05 LAB — URINALYSIS W MICROSCOPIC + REFLEX CULTURE
Bilirubin Urine: NEGATIVE
Casts: NONE SEEN
Glucose, UA: NEGATIVE mg/dL
Ketones, ur: NEGATIVE mg/dL
WBC, UA: NONE SEEN WBC/hpf (ref <3)
pH: 5.5 (ref 5.0–8.0)

## 2012-08-05 MED ORDER — ZOLPIDEM TARTRATE 10 MG PO TABS: 10.0000 mg | ORAL_TABLET | Freq: Every evening | ORAL | Status: DC | PRN

## 2012-08-05 NOTE — Patient Instructions (Signed)
Call if without menses by 8 weeks' postop. Continue on prenatal vitamins daily. Call if any issues or questions.

## 2012-08-05 NOTE — Progress Notes (Signed)
Patient presents postop status post suction D&C for missed AB 07/23/2012. Notes some diffuse lower pelvic discomfort. No fever chills nausea vomiting diarrhea constipation. Pathology confirmed POC. Bleeding scant to absent.  Exam with Sherrilyn Rist Asst. Spine straight without CVA tenderness. Abdomen soft nontender without masses guarding rebound organomegaly Pelvic external BUS vagina normal. Cervix normal. Uterus grossly normal midline mobile nontender. Adnexa without masses or tenderness.  Assessment and plan: Normal exam status post D&C first trimester missed AB doing well. We'll monitor and await first menses. Will call if without menses at 8 weeks postop. Continue on prenatal vitamins. Wait one or 2 cycles before attempting subsequent pregnancy.  I did prescribe Xanax as needed for anxiety previously but she is having problems sleeping I prescribed Ambien 10 mg #30 one by mouth each bedtime when necessary sleep no refill.

## 2012-08-06 ENCOUNTER — Telehealth: Payer: Self-pay | Admitting: *Deleted

## 2012-08-06 NOTE — Telephone Encounter (Signed)
Ok to write? 

## 2012-08-06 NOTE — Telephone Encounter (Signed)
Sent to [employer]. To write letter.

## 2012-08-06 NOTE — Telephone Encounter (Signed)
Post op D & C 07/23/12. Pt called requesting if another letter could be written for her to stay out of work until Friday 08/09/12. And return on Monday 08/12/12? You previously wrote for her to return to work today.Pt said she not in any pain, just would like to get some rest at home, states she is tired. Pt said she discuss this with you at her post op OV 08/05/12. Rarely getting sleep she did take the Ambien given and it slept good last night. Please advise

## 2012-08-07 ENCOUNTER — Encounter: Payer: Self-pay | Admitting: Gynecology

## 2012-08-07 NOTE — Telephone Encounter (Signed)
Letter faxed to employer at patient's request.

## 2013-07-13 ENCOUNTER — Emergency Department (HOSPITAL_BASED_OUTPATIENT_CLINIC_OR_DEPARTMENT_OTHER): Payer: No Typology Code available for payment source

## 2013-07-13 ENCOUNTER — Emergency Department (HOSPITAL_BASED_OUTPATIENT_CLINIC_OR_DEPARTMENT_OTHER)
Admission: EM | Admit: 2013-07-13 | Discharge: 2013-07-13 | Disposition: A | Discharge status: Home / Self Care | Payer: No Typology Code available for payment source | Attending: Emergency Medicine | Admitting: Emergency Medicine

## 2013-07-13 ENCOUNTER — Encounter (HOSPITAL_BASED_OUTPATIENT_CLINIC_OR_DEPARTMENT_OTHER): Payer: Self-pay | Admitting: Emergency Medicine

## 2013-07-13 DIAGNOSIS — S139XXA Sprain of joints and ligaments of unspecified parts of neck, initial encounter: Secondary | ICD-10-CM | POA: Insufficient documentation

## 2013-07-13 DIAGNOSIS — J45909 Unspecified asthma, uncomplicated: Secondary | ICD-10-CM | POA: Insufficient documentation

## 2013-07-13 DIAGNOSIS — Z8619 Personal history of other infectious and parasitic diseases: Secondary | ICD-10-CM | POA: Insufficient documentation

## 2013-07-13 DIAGNOSIS — Z8742 Personal history of other diseases of the female genital tract: Secondary | ICD-10-CM | POA: Insufficient documentation

## 2013-07-13 DIAGNOSIS — S161XXA Strain of muscle, fascia and tendon at neck level, initial encounter: Secondary | ICD-10-CM

## 2013-07-13 DIAGNOSIS — Z79899 Other long term (current) drug therapy: Secondary | ICD-10-CM | POA: Insufficient documentation

## 2013-07-13 DIAGNOSIS — Z862 Personal history of diseases of the blood and blood-forming organs and certain disorders involving the immune mechanism: Secondary | ICD-10-CM | POA: Insufficient documentation

## 2013-07-13 DIAGNOSIS — Y92410 Unspecified street and highway as the place of occurrence of the external cause: Secondary | ICD-10-CM | POA: Insufficient documentation

## 2013-07-13 DIAGNOSIS — Y9389 Activity, other specified: Secondary | ICD-10-CM | POA: Insufficient documentation

## 2013-07-13 MED ORDER — HYDROCODONE-ACETAMINOPHEN 5-325 MG PO TABS
2.0000 | ORAL_TABLET | ORAL | Status: DC | PRN
Start: 2013-07-13 — End: 2013-09-23
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
## 2013-07-13 NOTE — ED Notes (Signed)
MD back at bedside for disposition 

## 2013-07-13 NOTE — ED Notes (Signed)
Patients necklace was removed in radiology for cervical C-spine x-rays.  Pt was given necklace back,  in a pink denture cup before leaving radiology.
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
## 2013-07-13 NOTE — ED Notes (Signed)
MD at bedside. 

## 2013-07-13 NOTE — ED Notes (Signed)
Patient returned from xray.

## 2013-09-23 ENCOUNTER — Encounter: Payer: Self-pay | Admitting: Gynecology

## 2013-09-23 ENCOUNTER — Ambulatory Visit (INDEPENDENT_AMBULATORY_CARE_PROVIDER_SITE_OTHER): Payer: 59 | Admitting: Gynecology

## 2013-09-23 VITALS — BP 116/74 | Ht 65.0 in | Wt 165.0 lb

## 2013-09-23 DIAGNOSIS — N9089 Other specified noninflammatory disorders of vulva and perineum: Secondary | ICD-10-CM

## 2013-09-23 DIAGNOSIS — Z01419 Encounter for gynecological examination (general) (routine) without abnormal findings: Secondary | ICD-10-CM

## 2013-09-23 NOTE — Progress Notes (Signed)
Laurie Duffy 07-Nov-1975 [id number]        37 y.o.  G2P0010 for annual exam.  Doing well without complaints.  Past medical history,surgical history, problem list, medications, allergies, family history and social history were all reviewed and documented in the EPIC chart.  ROS:  Performed and pertinent positives and negatives are included in the history, assessment and plan .  Exam: Kim assistant Filed Vitals:   09/23/13 1559  BP: 116/74  Height: 5\' 5"  (1.651 m)  Weight: 165 lb (74.844 kg)   General appearance  Normal Skin grossly normal Head/Neck normal with no cervical or supraclavicular adenopathy thyroid normal Lungs  clear Cardiac RR, without RMG Abdominal  soft, nontender, without masses, organomegaly or hernia Breasts  examined lying and sitting without masses, retractions, discharge or axillary adenopathy. Pelvic  Ext/BUS/vagina  Normal with small benign appearing skin papule left lower labia majora  Cervix  Normal   Uterus  anteverted, normal size, shape and contour, midline and mobile nontender   Adnexa  Without masses or tenderness    Anus and perineum  Normal   Rectovaginal  Normal sphincter tone without palpated masses or tenderness.    Assessment/Plan:  37 y.o. G2P0010 female for annual exam, regular menses, condom contraception.   1. Contraception patient using condoms at present. Had miscarriage last year and husband is struggling with this emotionally is not ready to pursue pregnancy at this time. I reviewed the issues of advancing maternal age with decreased fecundity and increased risk of chromosomal abnormality/miscarriage as well as her issue with endometriosis and possible recurrence adversely affecting fertility. Discussed possible counseling for her husband but apparently he refuses. Patient states though that she thinks he is coming around. Recommend multivitamin with folic acid/prenatal vitamin preconceptionaly 2. Benign appearing skin papule left lower  vulva. Present for years historically unchanged. Patient will continue with observation. As long as it remains unchanged and she will follow as it does not bother her. If it enlarges or changes she knows to represent for excision. 3. Breast health. Screening mammographic recommendations between 35 and 40 reviewed. No strong family history of breast cancer. Patient prefers to wait closer to 40. SBE monthly reviewed. 4. Pap smear 2012. No Pap smear done today. No history of abnormal Pap smears previously. Plan repeat Pap smear next year a 3 year interval. 5. Health maintenance. No routine blood work done this is done through her primary physician's office. Followup one year, sooner as needed.   Note: This document was prepared with digital dictation and possible smart phrase technology. Any transcriptional errors that result from this process are unintentional.   Dara Lords MD, 4:53 PM 09/23/2013

## 2013-09-23 NOTE — Patient Instructions (Addendum)
Begin,.stay on a multivitamin with folate acid. Followup in one year, sooner as needed.

## 2013-09-24 LAB — URINALYSIS W MICROSCOPIC + REFLEX CULTURE
Glucose, UA: NEGATIVE mg/dL
Hgb urine dipstick: NEGATIVE
Leukocytes, UA: NEGATIVE
Nitrite: NEGATIVE
Protein, ur: NEGATIVE mg/dL

## 2014-08-17 ENCOUNTER — Encounter: Payer: Self-pay | Admitting: Gynecology

## 2014-09-25 ENCOUNTER — Encounter: Payer: 59 | Admitting: Gynecology
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
## 2014-11-11 ENCOUNTER — Encounter: Payer: 59 | Admitting: Gynecology

## 2014-12-24 ENCOUNTER — Ambulatory Visit (INDEPENDENT_AMBULATORY_CARE_PROVIDER_SITE_OTHER): Payer: BC Managed Care – PPO | Admitting: Gynecology

## 2014-12-24 ENCOUNTER — Encounter: Payer: Self-pay | Admitting: Gynecology

## 2014-12-24 ENCOUNTER — Other Ambulatory Visit (HOSPITAL_COMMUNITY)
Admission: RE | Admit: 2014-12-24 | Discharge: 2014-12-24 | Disposition: A | Discharge status: Home / Self Care | Payer: BC Managed Care – PPO | Source: Ambulatory Visit | Attending: Gynecology | Admitting: Gynecology

## 2014-12-24 VITALS — BP 120/72 | Ht 66.0 in | Wt 152.0 lb

## 2014-12-24 DIAGNOSIS — Z01419 Encounter for gynecological examination (general) (routine) without abnormal findings: Secondary | ICD-10-CM | POA: Diagnosis not present

## 2014-12-24 DIAGNOSIS — Z1151 Encounter for screening for human papillomavirus (HPV): Secondary | ICD-10-CM | POA: Insufficient documentation

## 2014-12-24 DIAGNOSIS — N926 Irregular menstruation, unspecified: Secondary | ICD-10-CM

## 2014-12-24 LAB — COMPREHENSIVE METABOLIC PANEL
ALK PHOS: 53 U/L (ref 39–117)
ALT: 12 U/L (ref 0–35)
AST: 13 U/L (ref 0–37)
Albumin: 4.6 g/dL (ref 3.5–5.2)
BUN: 18 mg/dL (ref 6–23)
CALCIUM: 9.2 mg/dL (ref 8.4–10.5)
CHLORIDE: 104 meq/L (ref 96–112)
CO2: 25 mEq/L (ref 19–32)
CREATININE: 0.74 mg/dL (ref 0.50–1.10)
Glucose, Bld: 85 mg/dL (ref 70–99)
POTASSIUM: 4.5 meq/L (ref 3.5–5.3)
SODIUM: 137 meq/L (ref 135–145)
Total Bilirubin: 0.5 mg/dL (ref 0.2–1.2)
Total Protein: 6.9 g/dL (ref 6.0–8.3)

## 2014-12-24 LAB — LIPID PANEL
CHOL/HDL RATIO: 2.1 ratio
CHOLESTEROL: 140 mg/dL (ref 0–200)
HDL: 68 mg/dL (ref >=46)
LDL Cholesterol: 62 mg/dL (ref 0–99)
TRIGLYCERIDES: 48 mg/dL (ref <150)
VLDL: 10 mg/dL (ref 0–40)

## 2014-12-24 NOTE — Addendum Note (Signed)
Addended by: Nelva Nay on: 12/24/2014 04:50 PM   Modules accepted: Orders

## 2014-12-24 NOTE — Patient Instructions (Signed)
Office will contact you to arrange appointment with Dr. Kerin Perna, infertility specialist  You may obtain a copy of any labs that were done today by logging onto MyChart as outlined in the instructions provided with your AVS (after visit summary). The office will not call with normal lab results but certainly if there are any significant abnormalities then we will contact you.   Health Maintenance, Female A healthy lifestyle and preventative care can promote health and wellness.  Maintain regular health, dental, and eye exams.  Eat a healthy diet. Foods like vegetables, fruits, whole grains, low-fat dairy products, and lean protein foods contain the nutrients you need without too many calories. Decrease your intake of foods high in solid fats, added sugars, and salt. Get information about a proper diet from your caregiver, if necessary.  Regular physical exercise is one of the most important things you can do for your health. Most adults should get at least 150 minutes of moderate-intensity exercise (any activity that increases your heart rate and causes you to sweat) each week. In addition, most adults need muscle-strengthening exercises on 2 or more days a week.   Maintain a healthy weight. The body mass index (BMI) is a screening tool to identify possible weight problems. It provides an estimate of body fat based on height and weight. Your caregiver can help determine your BMI, and can help you achieve or maintain a healthy weight. For adults 20 years and older:  A BMI below 18.5 is considered underweight.  A BMI of 18.5 to 24.9 is normal.  A BMI of 25 to 29.9 is considered overweight.  A BMI of 30 and above is considered obese.  Maintain normal blood lipids and cholesterol by exercising and minimizing your intake of saturated fat. Eat a balanced diet with plenty of fruits and vegetables. Blood tests for lipids and cholesterol should begin at age 57 and be repeated every 5 years. If your  lipid or cholesterol levels are high, you are over 50, or you are a high risk for heart disease, you may need your cholesterol levels checked more frequently.Ongoing high lipid and cholesterol levels should be treated with medicines if diet and exercise are not effective.  If you smoke, find out from your caregiver how to quit. If you do not use tobacco, do not start.  Lung cancer screening is recommended for adults aged 87 80 years who are at high risk for developing lung cancer because of a history of smoking. Yearly low-dose computed tomography (CT) is recommended for people who have at least a 30-pack-year history of smoking and are a current smoker or have quit within the past 15 years. A pack year of smoking is smoking an average of 1 pack of cigarettes a day for 1 year (for example: 1 pack a day for 30 years or 2 packs a day for 15 years). Yearly screening should continue until the smoker has stopped smoking for at least 15 years. Yearly screening should also be stopped for people who develop a health problem that would prevent them from having lung cancer treatment.  If you are pregnant, do not drink alcohol. If you are breastfeeding, be very cautious about drinking alcohol. If you are not pregnant and choose to drink alcohol, do not exceed 1 drink per day. One drink is considered to be 12 ounces (355 mL) of beer, 5 ounces (148 mL) of wine, or 1.5 ounces (44 mL) of liquor.  Avoid use of street drugs. Do not share needles  with anyone. Ask for help if you need support or instructions about stopping the use of drugs.  High blood pressure causes heart disease and increases the risk of stroke. Blood pressure should be checked at least every 1 to 2 years. Ongoing high blood pressure should be treated with medicines, if weight loss and exercise are not effective.  If you are 32 to 40 years old, ask your caregiver if you should take aspirin to prevent strokes.  Diabetes screening involves taking a  blood sample to check your fasting blood sugar level. This should be done once every 3 years, after age 82, if you are within normal weight and without risk factors for diabetes. Testing should be considered at a younger age or be carried out more frequently if you are overweight and have at least 1 risk factor for diabetes.  Breast cancer screening is essential preventative care for women. You should practice "breast self-awareness." This means understanding the normal appearance and feel of your breasts and may include breast self-examination. Any changes detected, no matter how small, should be reported to a caregiver. Women in their 74s and 30s should have a clinical breast exam (CBE) by a caregiver as part of a regular health exam every 1 to 3 years. After age 40, women should have a CBE every year. Starting at age 26, women should consider having a mammogram (breast X-ray) every year. Women who have a family history of breast cancer should talk to their caregiver about genetic screening. Women at a high risk of breast cancer should talk to their caregiver about having an MRI and a mammogram every year.  Breast cancer gene (BRCA)-related cancer risk assessment is recommended for women who have family members with BRCA-related cancers. BRCA-related cancers include breast, ovarian, tubal, and peritoneal cancers. Having family members with these cancers may be associated with an increased risk for harmful changes (mutations) in the breast cancer genes BRCA1 and BRCA2. Results of the assessment will determine the need for genetic counseling and BRCA1 and BRCA2 testing.  The Pap test is a screening test for cervical cancer. Women should have a Pap test starting at age 87. Between ages 67 and 36, Pap tests should be repeated every 2 years. Beginning at age 51, you should have a Pap test every 3 years as long as the past 3 Pap tests have been normal. If you had a hysterectomy for a problem that was not cancer or  a condition that could lead to cancer, then you no longer need Pap tests. If you are between ages 37 and 54, and you have had normal Pap tests going back 10 years, you no longer need Pap tests. If you have had past treatment for cervical cancer or a condition that could lead to cancer, you need Pap tests and screening for cancer for at least 20 years after your treatment. If Pap tests have been discontinued, risk factors (such as a new sexual partner) need to be reassessed to determine if screening should be resumed. Some women have medical problems that increase the chance of getting cervical cancer. In these cases, your caregiver may recommend more frequent screening and Pap tests.  The human papillomavirus (HPV) test is an additional test that may be used for cervical cancer screening. The HPV test looks for the virus that can cause the cell changes on the cervix. The cells collected during the Pap test can be tested for HPV. The HPV test could be used to screen women aged  30 years and older, and should be used in women of any age who have unclear Pap test results. After the age of 35, women should have HPV testing at the same frequency as a Pap test.  Colorectal cancer can be detected and often prevented. Most routine colorectal cancer screening begins at the age of 84 and continues through age 54. However, your caregiver may recommend screening at an earlier age if you have risk factors for colon cancer. On a yearly basis, your caregiver may provide home test kits to check for hidden blood in the stool. Use of a small camera at the end of a tube, to directly examine the colon (sigmoidoscopy or colonoscopy), can detect the earliest forms of colorectal cancer. Talk to your caregiver about this at age 42, when routine screening begins. Direct examination of the colon should be repeated every 5 to 10 years through age 39, unless early forms of pre-cancerous polyps or small growths are found.  Hepatitis C  blood testing is recommended for all people born from [birth year] through 1966 and any individual with known risks for hepatitis C.  Practice safe sex. Use condoms and avoid high-risk sexual practices to reduce the spread of sexually transmitted infections (STIs). Sexually active women aged 53 and younger should be checked for Chlamydia, which is a common sexually transmitted infection. Older women with new or multiple partners should also be tested for Chlamydia. Testing for other STIs is recommended if you are sexually active and at increased risk.  Osteoporosis is a disease in which the bones lose minerals and strength with aging. This can result in serious bone fractures. The risk of osteoporosis can be identified using a bone density scan. Women ages 13 and over and women at risk for fractures or osteoporosis should discuss screening with their caregivers. Ask your caregiver whether you should be taking a calcium supplement or vitamin D to reduce the rate of osteoporosis.  Menopause can be associated with physical symptoms and risks. Hormone replacement therapy is available to decrease symptoms and risks. You should talk to your caregiver about whether hormone replacement therapy is right for you.  Use sunscreen. Apply sunscreen liberally and repeatedly throughout the day. You should seek shade when your shadow is shorter than you. Protect yourself by wearing long sleeves, pants, a wide-brimmed hat, and sunglasses year round, whenever you are outdoors.  Notify your caregiver of new moles or changes in moles, especially if there is a change in shape or color. Also notify your caregiver if a mole is larger than the size of a pencil eraser.  Stay current with your immunizations. Document Released: 04/17/2011 Document Revised: 01/27/2013 Document Reviewed: 04/17/2011 [facility] Patient Information 2014 [place].

## 2014-12-24 NOTE — Progress Notes (Signed)
Laurie Duffy September 09, 1976 [id number]        39 y.o.  G2P0010 for annual exam.  Several issues noted below.  Past medical history,surgical history, problem list, medications, allergies, family history and social history were all reviewed and documented as reviewed in the EPIC chart.  ROS:  Performed with pertinent positives and negatives included in the history, assessment and plan.   Additional significant findings :  Right hand dropping objects 3 episodes  Exam: Kim assistant Filed Vitals:   12/24/14 1543  BP: 120/72  Height: 5\' 6"  (1.676 m)  Weight: 152 lb (68.947 kg)   General appearance:  Normal affect, orientation and appearance. Skin: Grossly normal HEENT: Without gross lesions.  No cervical or supraclavicular adenopathy. Thyroid normal.  Lungs:  Clear without wheezing, rales or rhonchi Cardiac: RR, without RMG Abdominal:  Soft, nontender, without masses, guarding, rebound, organomegaly or hernia Breasts:  Examined lying and sitting without masses, retractions, discharge or axillary adenopathy. Pelvic:  Ext/BUS/vagina with light menses flow.  Mild distortion left upper vaginal mucosa consistent with her prior history of vaginal cyst excision. Small benign-appearing skin papule left lower labia majora stable times years.  Cervix , normal. Pap/HPV  Uterus axial, normal size, shape and contour, midline and mobile nontender   Adnexa  Without masses or tenderness    Anus and perineum  Normal   Rectovaginal  Normal sphincter tone without palpated masses or tenderness.    Assessment/Plan:  39 y.o. G17P0010 female for annual exam with irregular menses, no contraception.   1. Irregular menses. Patient notes her menses occur every 2-4 weeks. No bleeding in between. 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 Check baseline labs to include [place] TSH prolactin and progesterone level.  Patient attempting pregnancy now having unprotected intercourse. Options for fertility to include laboratory stimulant such as Clomid, gonadotropins with insemination up to including in vitro reviewed. Given the total picture recommend follow up with reproductive endocrinologist to have this discussion and decided further testing is necessary such as HSG or repeat laparoscopy. Will facilitate an appointment with Dr. Kerin Perna and patient agrees with this.  I did review the issues of pregnancy in a 39 year old to include increased risk of chromosomal abnormalities in the availability of prenatal diagnostic techniques. 2. Pap smear 2012. Pap smear/HPV today. No history of significant abnormal Pap smears previously. 3. Screening mammography. Patient turning 39 this year. Recommended that she proceed with screening mammography sometime this year.  SBE monthly reviewed. 4. Patient requests baseline labs now. She is scheduling an appointment with her primary and will provide these to them. CBC comprehensive metabolic panel lipid profile ordered along with her above lab work. No urinalysis done given her bleeding at this point.     Anastasio Auerbach MD, 4:32 PM 12/24/2014

## 2014-12-25 LAB — FOLLICLE STIMULATING HORMONE: FSH: 8.1 m[IU]/mL

## 2014-12-25 LAB — CBC WITH DIFFERENTIAL/PLATELET
BASOS ABS: 0.1 10*3/uL (ref 0.0–0.1)
Basophils Relative: 1 % (ref 0–1)
EOS PCT: 2 % (ref 0–5)
Eosinophils Absolute: 0.1 10*3/uL (ref 0.0–0.7)
HEMATOCRIT: 40.3 % (ref 36.0–46.0)
HEMOGLOBIN: 13.3 g/dL (ref 12.0–15.0)
LYMPHS ABS: 2.6 10*3/uL (ref 0.7–4.0)
Lymphocytes Relative: 44 % (ref 12–46)
MCH: 31.8 pg (ref 26.0–34.0)
MCHC: 33 g/dL (ref 30.0–36.0)
MCV: 96.4 fL (ref 78.0–100.0)
MONO ABS: 0.5 10*3/uL (ref 0.1–1.0)
MONOS PCT: 9 % (ref 3–12)
MPV: 8.7 fL (ref 8.6–12.4)
NEUTROS PCT: 44 % (ref 43–77)
Neutro Abs: 2.6 10*3/uL (ref 1.7–7.7)
Platelets: 240 10*3/uL (ref 150–400)
RBC: 4.18 MIL/uL (ref 3.87–5.11)
RDW: 14 % (ref 11.5–15.5)
WBC: 6 10*3/uL (ref 4.0–10.5)

## 2014-12-25 LAB — PROLACTIN: Prolactin: 10 ng/mL

## 2014-12-25 LAB — PROGESTERONE: Progesterone: 7.6 ng/mL

## 2014-12-25 LAB — TSH: TSH: 2.036 u[IU]/mL (ref 0.350–4.500)

## 2014-12-28 LAB — CYTOLOGY - PAP

## 2015-01-18 ENCOUNTER — Other Ambulatory Visit: Payer: Self-pay | Admitting: Family Medicine

## 2015-01-18 ENCOUNTER — Ambulatory Visit
Admission: RE | Admit: 2015-01-18 | Discharge: 2015-01-18 | Disposition: A | Discharge status: Home / Self Care | Payer: BC Managed Care – PPO | Source: Ambulatory Visit | Attending: Family Medicine | Admitting: Family Medicine

## 2015-01-18 DIAGNOSIS — M25531 Pain in right wrist: Secondary | ICD-10-CM

## 2016-01-11 ENCOUNTER — Ambulatory Visit (INDEPENDENT_AMBULATORY_CARE_PROVIDER_SITE_OTHER): Payer: BC Managed Care – PPO | Admitting: Gynecology

## 2016-01-11 ENCOUNTER — Encounter: Payer: Self-pay | Admitting: Gynecology

## 2016-01-11 VITALS — BP 116/74 | Ht 66.0 in | Wt 162.0 lb

## 2016-01-11 DIAGNOSIS — Z01419 Encounter for gynecological examination (general) (routine) without abnormal findings: Secondary | ICD-10-CM | POA: Diagnosis not present

## 2016-01-11 NOTE — Patient Instructions (Signed)
Call to Schedule your mammogram  Facilities in Between: 1)  [facility]. [facility], [address] Phone: [phone] 2)  Dr. Bertrand at [facility]  [address] Phone: [phone]     Mammogram A mammogram is an X-ray test to find changes in a woman's breast. You should get a mammogram if:  You are 40 years of age or older  You have risk factors.   Your doctor recommends that you have one.  BEFORE THE TEST  Do not schedule the test the week before your period, especially if your breasts are sore during this time.  On the day of your mammogram:  Wash your breasts and armpits well. After washing, do not put on any deodorant or talcum powder on until after your test.   Eat and drink as you usually do.   Take your medicines as usual.   If you are diabetic and take insulin, make sure you:   Eat before coming for your test.   Take your insulin as usual.   If you cannot keep your appointment, call before the appointment to cancel. Schedule another appointment.  TEST  You will need to undress from the waist up. You will put on a hospital gown.   Your breast will be put on the mammogram machine, and it will press firmly on your breast with a piece of plastic called a compression paddle. This will make your breast flatter so that the machine can X-ray all parts of your breast.   Both breasts will be X-rayed. Each breast will be X-rayed from above and from the side. An X-ray might need to be taken again if the picture is not good enough.   The mammogram will last about 15 to 30 minutes.  AFTER THE TEST Finding out the results of your test Ask when your test results will be ready. Make sure you get your test results.  Document Released: 12/29/2008 Document Revised: 09/21/2011 Document Reviewed: 12/29/2008 [employer] Patient Information 2012 [employer].   

## 2016-01-11 NOTE — Progress Notes (Signed)
    Laurie Duffy 07/01/1976 [id number]        39 y.o.  G1P0010  for annual exam.  Doing well.  Past medical history,surgical history, problem list, medications, allergies, family history and social history were all reviewed and documented as reviewed in the EPIC chart.  ROS:  Performed with pertinent positives and negatives included in the history, assessment and plan.   Additional significant findings :  none   Exam: Leanne Lovely Vitals:   01/11/16 1555  BP: 116/74  Height: 5\' 6"  (1.676 m)  Weight: 162 lb (73.483 kg)   General appearance:  Normal affect, orientation and appearance. 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  No cervical or supraclavicular adenopathy. Thyroid normal.  Lungs:  Clear without wheezing, rales or rhonchi Cardiac: RR, without RMG Abdominal:  Soft, nontender, without masses, guarding, rebound, organomegaly or hernia Breasts:  Examined lying and sitting without masses, retractions, discharge or axillary adenopathy. Pelvic:  Ext/BUS/vagina normal  Cervix normal  Uterus anteverted, normal size, shape and contour, midline and mobile nontender   Adnexa without masses or tenderness    Anus and perineum normal   Rectovaginal normal sphincter tone without palpated masses or tenderness.    Assessment/Plan:  40 y.o. G81P0010 female for annual exam with regular menses, no contraception.   1. Contraception. Patient and husband are attempting pregnancy. Having regular monthly menses. Normal lab work last year. Had discussed referral to reproductive endocrinologist but they are not interested in this. The plans are to spontaneously try this year and up without pregnancy to pursue adoption. Will call me if they change her mind for referral to reproductive endocrinologist. Continue on multivitamins with folic acid daily. 2. Pap smear/HPV 12/2014 negative. Pap smear done today. No  history of significant abnormal Pap smears previously. 3. Mammography never. Patient plans to schedule this year at age 67. Names and numbers provided. SBE monthly reviewed. 4. Health maintenance. No routine lab work done as patient reports this done through her primary physician's office. Follow up 1 year, sooner as needed.   Anastasio Auerbach MD, 4:22 PM 01/11/2016

## 2016-09-06 ENCOUNTER — Other Ambulatory Visit: Payer: Self-pay | Admitting: Orthopedic Surgery

## 2016-09-06 DIAGNOSIS — M25531 Pain in right wrist: Secondary | ICD-10-CM

## 2016-09-26 ENCOUNTER — Inpatient Hospital Stay: Admission: RE | Admit: 2016-09-26 | Payer: BC Managed Care – PPO | Source: Ambulatory Visit

## 2016-09-26 ENCOUNTER — Other Ambulatory Visit: Payer: BC Managed Care – PPO

## 2016-10-27 ENCOUNTER — Encounter: Payer: Self-pay | Admitting: Gynecology

## 2016-10-30 ENCOUNTER — Telehealth: Payer: Self-pay | Admitting: *Deleted

## 2016-10-30 DIAGNOSIS — D1800 Hemangioma unspecified site: Secondary | ICD-10-CM

## 2016-10-30 NOTE — Telephone Encounter (Signed)
Schedule an appointment with vascular surgery reference hemangioma anterior chest wall  Referral faxed to Vascular and Vein specialist Rip Harbour will contact pt to schedule

## 2016-10-30 NOTE — Telephone Encounter (Signed)
Schedule an appointment with vascular surgery reference hemangioma anterior chest wall

## 2016-10-31 NOTE — Telephone Encounter (Signed)
Dr.Fontaine I contacted [facility] spoke with Jenny Reichmann (referral coordinator) was informed that patient will not be scheduled without imaging confirming the diagnosis. Please advise

## 2016-10-31 NOTE — Telephone Encounter (Signed)
If you could check with their office and ask them what they recommend as far as imaging before I refer her.

## 2016-11-03 ENCOUNTER — Encounter: Payer: Self-pay | Admitting: *Deleted

## 2016-11-03 NOTE — Telephone Encounter (Signed)
Order placed, pt scheduled on 11/20/16 @ 9:15am at [facility], patient will need to be NPO 4 hours prior to scan, will need to pick up oral contrast at least 1 day prior to scan at 1st floor radiology. [phone] scheduling number to call to reschedule if needed. This will be sent to patient in my chart message.   Thurmond Butts said once scheduled and results are back Jenny Reichmann will call and schedule patient at [facility].

## 2016-11-03 NOTE — Telephone Encounter (Signed)
I called and spoke with Nurse Rayna and she is going to check with provider to see what they recommend for imaging

## 2016-11-03 NOTE — Telephone Encounter (Signed)
I think that supportive good size and that she should be seen by the thoracic surgeons so that they can make a decision as to who should treat her. Okay for CT with contrast of chest reference right anterior chest wall hemangioma

## 2016-11-03 NOTE — Telephone Encounter (Signed)
I spoke with Thurmond Butts (nurse) at [facility] and she asked one of the surgeons and was told that if the area was big on the anterior chest wall patient will need to ct scan of chest with contrast, if the area is small in size then a dermatologist would handle this. Please advise

## 2016-11-09 ENCOUNTER — Telehealth: Payer: Self-pay

## 2016-11-15 NOTE — Telephone Encounter (Signed)
Dr.Fontaine sent me a staff message stating "Can we please make an appointment with the vascular surgery service to let them look at this and decide what they want to order. I know they did not want to see her first but can you explain that they will not let me order testing and I'm going to have a hard time explaining why I need this for another physician's use. I would really appreciate if they would triage as to where her hemangioma is best dealt with."  I called and spoke with Thurmond Butts (nurse) she spoke with Dr. Tharon Aquas Trigt pt scheduled on 11/22/16 at his office and he is willing to see the patient before CT scan imaging. Ryan asked me to cancel the CT scan chest ( done) and Dr. Kerby Less will order any imaging he feels is needed, the pt will keep appointment schedule on 11/22/16. Pt is aware of this .

## 2016-11-20 ENCOUNTER — Ambulatory Visit (HOSPITAL_COMMUNITY): Payer: BC Managed Care – PPO

## 2016-11-21 ENCOUNTER — Encounter: Payer: Self-pay | Admitting: Thoracic Surgery (Cardiothoracic Vascular Surgery)

## 2016-11-21 ENCOUNTER — Institutional Professional Consult (permissible substitution) (INDEPENDENT_AMBULATORY_CARE_PROVIDER_SITE_OTHER): Payer: Self-pay | Admitting: Thoracic Surgery (Cardiothoracic Vascular Surgery)

## 2016-11-21 VITALS — BP 118/75 | HR 76 | Resp 20 | Ht 66.0 in | Wt 163.0 lb

## 2016-11-21 DIAGNOSIS — D1809 Hemangioma of other sites: Secondary | ICD-10-CM

## 2016-11-21 DIAGNOSIS — D1800 Hemangioma unspecified site: Secondary | ICD-10-CM

## 2016-11-21 NOTE — Progress Notes (Signed)
      [address]       [place] [address]             [phone]      41 yo woman with a birthmark on the right upper breast. Has recently gotten a little larger.   Hemangioma of skin of right breast. Some palpable soft tissue under the area. Mobile with no evidence of attachment to the underlying chest wall.  This is not a thoracic surgical problem. While I do not think this is serious, this is not an area I have any experience with.   I advised her to see a Industrial/product designer to further evaluate.  She will take my advice under consideration, but does not want a referral at this time.   No charge for today's visit (copay refunded) as this was not a thoracic surgical issue  Revonda Standard. Roxan Hockey, MD [facility] [phone]

## 2016-11-22 ENCOUNTER — Encounter: Payer: BC Managed Care – PPO | Admitting: Cardiothoracic Surgery

## 2017-10-02 ENCOUNTER — Encounter: Payer: BC Managed Care – PPO | Admitting: Gynecology

## 2017-10-05 ENCOUNTER — Encounter: Payer: Self-pay | Admitting: Gynecology

## 2017-10-05 ENCOUNTER — Ambulatory Visit: Payer: BC Managed Care – PPO | Admitting: Gynecology

## 2017-10-05 VITALS — BP 116/76 | Ht 65.0 in | Wt 151.0 lb

## 2017-10-05 DIAGNOSIS — Z113 Encounter for screening for infections with a predominantly sexual mode of transmission: Secondary | ICD-10-CM

## 2017-10-05 DIAGNOSIS — Z01419 Encounter for gynecological examination (general) (routine) without abnormal findings: Secondary | ICD-10-CM | POA: Diagnosis not present

## 2017-10-05 NOTE — Progress Notes (Signed)
    Laurie Duffy [birth year] [id number]        41 y.o.  G1P0010 for annual gynecologic exam.    Past medical history,surgical history, problem list, medications, allergies, family history and social history were all reviewed and documented as reviewed in the EPIC chart.  ROS:  Performed with pertinent positives and negatives included in the history, assessment and plan.   Additional significant findings : None   Exam: Caryn Bee assistant Vitals:   10/05/17 1551  BP: 116/76  Weight: 151 lb (68.5 kg)  Height: 5\' 5"  (1.651 m)   Body mass index is 25.13 kg/m.  General appearance:  Normal affect, orientation and appearance. Skin: Grossly normal excepting hemangioma right anterior upper chest wall HEENT: Without gross lesions.  No cervical or supraclavicular adenopathy. Thyroid normal.  Lungs:  Clear without wheezing, rales or rhonchi Cardiac: RR, without RMG Abdominal:  Soft, nontender, without masses, guarding, rebound, organomegaly or hernia Breasts:  Examined lying and sitting without masses, retractions, discharge or axillary adenopathy. Pelvic:  Ext, BUS, Vagina: Normal  Cervix: Normal  Uterus: Anteverted, normal size, shape and contour, midline and mobile nontender   Adnexa: Without masses or tenderness    Anus and perineum: Normal   Rectovaginal: Normal sphincter tone without palpated masses or tenderness.    Assessment/Plan:  41 y.o. G103P0010 female for annual gynecologic exam with regular menses, abstinent contraception.   1. Abstinent contraception.  Patient and her husband have separated due to him having an affair.  We discussed STD screening and ultimately GC/chlamydia, hepatitis B, hepatitis C, HIV, RPR performed.  She is asymptomatic without vaginal discharge or other symptoms. 2. Mammography 08/2017.  Continue with annual mammography next year.  Breast exam normal today. 3. Pap smear/HPV 12/2014.  No Pap smear done today.  No history of abnormal Pap smears  previously.  Plan repeat Pap smear at 5-year interval per current screening guidelines. 4. Hemangioma right upper anterior chest wall.  I referred to thoracic surgeon earlier this year who told the patient he is not sure why she was there to see him this was not his area.  He dermatologist or Psychiatric nurse.  Given the size of the hemangioma and the potential for underlying involvement I do not think a routine plastic surgery follow-up or dermatology follow-up is necessarily appropriate.  I would think someone who is more experienced in hemangioma care would be better suited to see her.  We could arrange through one of the surrounding medical centers.  At this point though the patient does not want a referral and prefers to get through her separation and divorce and then deal with this later and she will call me whenever she wants to proceed with this referral. 5. Health maintenance.  No routine lab work done as patient does this elsewhere.  Follow-up 1 year, sooner as needed.   Anastasio Auerbach MD, 4:13 PM 10/05/2017

## 2017-10-05 NOTE — Patient Instructions (Signed)
Follow-up in 1 year for annual exam, sooner as needed. 

## 2017-10-06 LAB — C. TRACHOMATIS/N. GONORRHOEAE RNA
C. trachomatis RNA, TMA: NOT DETECTED
N. gonorrhoeae RNA, TMA: NOT DETECTED

## 2017-10-08 LAB — RPR: RPR: NONREACTIVE

## 2017-10-08 LAB — HEPATITIS C ANTIBODY
HEP C AB: NONREACTIVE
SIGNAL TO CUT-OFF: 0 (ref <1.00)

## 2017-10-08 LAB — HEPATITIS B SURFACE ANTIGEN: HEP B S AG: NONREACTIVE

## 2017-10-08 LAB — HIV ANTIBODY (ROUTINE TESTING W REFLEX): HIV 1&2 Ab, 4th Generation: NONREACTIVE

## 2017-10-10 ENCOUNTER — Encounter: Payer: Self-pay | Admitting: Gynecology

## 2018-10-15 ENCOUNTER — Encounter: Payer: Self-pay | Admitting: Gynecology

## 2018-10-15 ENCOUNTER — Ambulatory Visit: Payer: BC Managed Care – PPO | Admitting: Gynecology

## 2018-10-15 VITALS — BP 118/76 | Ht 66.0 in | Wt 138.0 lb

## 2018-10-15 DIAGNOSIS — Z01419 Encounter for gynecological examination (general) (routine) without abnormal findings: Secondary | ICD-10-CM | POA: Diagnosis not present

## 2018-10-15 NOTE — Progress Notes (Signed)
    Laurie Duffy Oct 31, 1975 [id number]        42 y.o.  G1P0010 for annual gynecologic exam.  Without gynecologic complaints  Past medical history,surgical history, problem list, medications, allergies, family history and social history were all reviewed and documented as reviewed in the EPIC chart.  ROS:  Performed with pertinent positives and negatives included in the history, assessment and plan.   Additional significant findings : None   Exam: Caryn Bee assistant Vitals:   10/15/18 0956  BP: 118/76  Weight: 138 lb (62.6 kg)  Height: '5\' 6"'$  (1.676 m)   Body mass index is 22.27 kg/m.  General appearance:  Normal affect, orientation and appearance. Skin: Grossly normal excepting hemangioma right upper anterior chest wall HEENT: Without gross lesions.  No cervical or supraclavicular adenopathy. Thyroid normal.  Lungs:  Clear without wheezing, rales or rhonchi Cardiac: RR, without RMG Abdominal:  Soft, nontender, without masses, guarding, rebound, organomegaly or hernia Breasts:  Examined lying and sitting without masses, retractions, discharge or axillary adenopathy. Pelvic:  Ext, BUS, Vagina: Normal  Cervix: Normal  Uterus: Anteverted, normal size, shape and contour, midline and mobile nontender   Adnexa: Without masses or tenderness    Anus and perineum: Normal   Rectovaginal: Normal sphincter tone without palpated masses or tenderness.    Assessment/Plan:  42 y.o. G52P0010 female for annual gynecologic exam with regular menses, abstinent contraception.   1. Abstinent contraception.  Undergoing divorce.  She did ask about if in the future she met someone what would be her pregnancy chances.  We discussed the decreased fecundity associated with advancing maternal age particularly in the mid 76s.  I directed her towards a reproductive endocrinologist to answer the questions specifically and the discussion if possible of freezing eggs.  At this point she is not interested in  referral. 2. Mammography due now and I reminded her to schedule this.  Breast exam normal today. 3. Pap smear/HPV 12/2014.  No Pap smear done today.  No history of abnormal Pap smears.  We will plan on follow-up Pap smear/HPV at 5-year interval per current screening guidelines. 4. Hemangioma right upper chest wall.  Stable over years observation.  She will continue to monitor and report any changes. 5. Health maintenance.  No routine lab work done as patient reports this done elsewhere.  Follow-up 1 year, sooner as needed.   Anastasio Auerbach MD, 10:23 AM 10/15/2018

## 2018-10-15 NOTE — Patient Instructions (Signed)
Follow-up in 1 year for annual exam 

## 2019-07-08 ENCOUNTER — Encounter: Payer: Self-pay | Admitting: Gynecology

## 2019-10-21 ENCOUNTER — Encounter: Payer: BC Managed Care – PPO | Admitting: Women's Health

## 2019-11-04 ENCOUNTER — Other Ambulatory Visit: Payer: Self-pay

## 2019-11-05 ENCOUNTER — Encounter: Payer: Self-pay | Admitting: Women's Health

## 2019-11-05 ENCOUNTER — Ambulatory Visit: Payer: BC Managed Care – PPO | Admitting: Women's Health

## 2019-11-05 VITALS — BP 118/76 | Ht 66.0 in | Wt 143.6 lb

## 2019-11-05 DIAGNOSIS — Z113 Encounter for screening for infections with a predominantly sexual mode of transmission: Secondary | ICD-10-CM

## 2019-11-05 DIAGNOSIS — Z01419 Encounter for gynecological examination (general) (routine) without abnormal findings: Secondary | ICD-10-CM | POA: Diagnosis not present

## 2019-11-05 DIAGNOSIS — Z1151 Encounter for screening for human papillomavirus (HPV): Secondary | ICD-10-CM

## 2019-11-05 DIAGNOSIS — Z13220 Encounter for screening for lipoid disorders: Secondary | ICD-10-CM | POA: Diagnosis not present

## 2019-11-05 NOTE — Progress Notes (Signed)
Laurie Duffy 19-Mar-1976 [id number]    History:    Presents for annual exam.  Regular monthly 3-day cycle with menstrual cramping during the cycle.  Normal Pap and mammogram history.  History of endometriosis.  Divorced 2 years ago which was a good thing and is in a new relationship.  Anxiety and depression stable on Lexapro and trazodone for primary care  Past medical history, past surgical history, family history and social history were all reviewed and documented in the EPIC chart.  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.  Exercises 2 hours daily, history of obesity in her early 24s has lost weight and has maintained..  ROS:  A ROS was performed and pertinent positives and negatives are included.  Exam:  Vitals:   11/05/19 1119  BP: 118/76  Weight: 143 lb 9.6 oz (65.1 kg)  Height: 5\' 6"  (1.676 m)   Body mass index is 23.18 kg/m.   General appearance:  Normal Thyroid:  Symmetrical, normal in size, without palpable masses or nodularity. Respiratory  Auscultation:  Clear without wheezing or rhonchi Cardiovascular  Auscultation:  Regular rate, without rubs, murmurs or gallops  Edema/varicosities:  Not grossly evident Abdominal  Soft,nontender, without masses, guarding or rebound.  Liver/spleen:  No organomegaly noted  Hernia:  None appreciated  Skin  Inspection:  Grossly normal   Breasts: Examined lying and sitting.     Right: Without masses, retractions, discharge or axillary adenopathy.     Left: Without masses, retractions, discharge or axillary adenopathy. Gentitourinary   Inguinal/mons:  Normal without inguinal adenopathy  External genitalia:  Normal  BUS/Urethra/Skene's glands:  Normal  Vagina:  Normal  Cervix:  Normal  Uterus:  normal in size, shape and contour.  Midline and mobile  Adnexa/parametria:     Rt: Without masses or tenderness.   Lt: Without masses or tenderness.  Anus and perineum: Normal  Digital rectal exam: Normal sphincter tone without palpated masses or  tenderness  Assessment/Plan:  45 y.o. D WF G1, P0 for annual exam with no complaints.    Regular monthly cycle/condoms/endometriosis Anxiety/depression stable on meds per primary care Right chest hemangioma since birth  Plan: Encouraged to continue exercise, reviewed 1 hour daily is adequate, encouraged increased leisure activities and self-care.  SBEs, continue annual screening mammogram, calcium rich foods, vitamin D 1000 daily encouraged.  Contraception reviewed, declines will continue condoms.  CBC, CMP, lipid panel, GC/chlamydia, HIV, RPR, Pap with HR HPV typing, Pap normal with negative HR HPV typing 2016, new screening guidelines reviewed.   Huel Cote [facility], 12:17 PM 11/05/2019

## 2019-11-05 NOTE — Patient Instructions (Signed)
Pleasure meeting you! Health Maintenance, Female Adopting a healthy lifestyle and getting preventive care are important in promoting health and wellness. Ask your health care provider about:  The right schedule for you to have regular tests and exams.  Things you can do on your own to prevent diseases and keep yourself healthy. What should I know about diet, weight, and exercise? Eat a healthy diet   Eat a diet that includes plenty of vegetables, fruits, low-fat dairy products, and lean protein.  Do not eat a lot of foods that are high in solid fats, added sugars, or sodium. Maintain a healthy weight Body mass index (BMI) is used to identify weight problems. It estimates body fat based on height and weight. Your health care provider can help determine your BMI and help you achieve or maintain a healthy weight. Get regular exercise Get regular exercise. This is one of the most important things you can do for your health. Most adults should:  Exercise for at least 150 minutes each week. The exercise should increase your heart rate and make you sweat (moderate-intensity exercise).  Do strengthening exercises at least twice a week. This is in addition to the moderate-intensity exercise.  Spend less time sitting. Even light physical activity can be beneficial. Watch cholesterol and blood lipids Have your blood tested for lipids and cholesterol at 45 years of age, then have this test every 5 years. Have your cholesterol levels checked more often if:  Your lipid or cholesterol levels are high.  You are older than 45 years of age.  You are at high risk for heart disease. What should I know about cancer screening? Depending on your health history and family history, you may need to have cancer screening at various ages. This may include screening for:  Breast cancer.  Cervical cancer.  Colorectal cancer.  Skin cancer.  Lung cancer. What should I know about heart disease, diabetes,  and high blood pressure? Blood pressure and heart disease  High blood pressure causes heart disease and increases the risk of stroke. This is more likely to develop in people who have high blood pressure readings, are of African descent, or are overweight.  Have your blood pressure checked: ? Every 3-5 years if you are 31-88 years of age. ? Every year if you are 10 years old or older. Diabetes Have regular diabetes screenings. This checks your fasting blood sugar level. Have the screening done:  Once every three years after age 70 if you are at a normal weight and have a low risk for diabetes.  More often and at a younger age if you are overweight or have a high risk for diabetes. What should I know about preventing infection? Hepatitis B If you have a higher risk for hepatitis B, you should be screened for this virus. Talk with your health care provider to find out if you are at risk for hepatitis B infection. Hepatitis C Testing is recommended for:  Everyone born from [birth year] through 1966.  Anyone with known risk factors for hepatitis C. Sexually transmitted infections (STIs)  Get screened for STIs, including gonorrhea and chlamydia, if: ? You are sexually active and are younger than 45 years of age. ? You are older than 45 years of age and your health care provider tells you that you are at risk for this type of infection. ? Your sexual activity has changed since you were last screened, and you are at increased risk for chlamydia or gonorrhea. Ask your health  care provider if you are at risk.  Ask your health care provider about whether you are at high risk for HIV. Your health care provider may recommend a prescription medicine to help prevent HIV infection. If you choose to take medicine to prevent HIV, you should first get tested for HIV. You should then be tested every 3 months for as long as you are taking the medicine. Pregnancy  If you are about to stop having your period  (premenopausal) and you may become pregnant, seek counseling before you get pregnant.  Take 400 to 800 micrograms (mcg) of folic acid every day if you become pregnant.  Ask for birth control (contraception) if you want to prevent pregnancy. Osteoporosis and menopause Osteoporosis is a disease in which the bones lose minerals and strength with aging. This can result in bone fractures. If you are 24 years old or older, or if you are at risk for osteoporosis and fractures, ask your health care provider if you should:  Be screened for bone loss.  Take a calcium or vitamin D supplement to lower your risk of fractures.  Be given hormone replacement therapy (HRT) to treat symptoms of menopause. Follow these instructions at home: Lifestyle  Do not use any products that contain nicotine or tobacco, such as cigarettes, e-cigarettes, and chewing tobacco. If you need help quitting, ask your health care provider.  Do not use street drugs.  Do not share needles.  Ask your health care provider for help if you need support or information about quitting drugs. Alcohol use  Do not drink alcohol if: ? Your health care provider tells you not to drink. ? You are pregnant, may be pregnant, or are planning to become pregnant.  If you drink alcohol: ? Limit how much you use to 0-1 drink a day. ? Limit intake if you are breastfeeding.  Be aware of how much alcohol is in your drink. In the [place] one drink equals one 12 oz bottle of beer (355 mL), one 5 oz glass of wine (148 mL), or one 1 oz glass of hard liquor (44 mL). General instructions  Schedule regular health, dental, and eye exams.  Stay current with your vaccines.  Tell your health care provider if: ? You often feel depressed. ? You have ever been abused or do not feel safe at home. Summary  Adopting a healthy lifestyle and getting preventive care are important in promoting health and wellness.  Follow your health care provider's  instructions about healthy diet, exercising, and getting tested or screened for diseases.  Follow your health care provider's instructions on monitoring your cholesterol and blood pressure. This information is not intended to replace advice given to you by your health care provider. Make sure you discuss any questions you have with your health care provider. Document Revised: 09/25/2018 Document Reviewed: 09/25/2018 Elsevier Patient Education  2020 [employer].

## 2019-11-06 LAB — CBC WITH DIFFERENTIAL/PLATELET
Absolute Monocytes: 353 cells/uL (ref 200–950)
Basophils Absolute: 21 cells/uL (ref 0–200)
Basophils Relative: 0.5 %
Eosinophils Absolute: 49 cells/uL (ref 15–500)
Eosinophils Relative: 1.2 %
HCT: 39.2 % (ref 35.0–45.0)
Hemoglobin: 13.2 g/dL (ref 11.7–15.5)
Lymphs Abs: 1615 cells/uL (ref 850–3900)
MCH: 34 pg — ABNORMAL HIGH (ref 27.0–33.0)
MCHC: 33.7 g/dL (ref 32.0–36.0)
MCV: 101 fL — ABNORMAL HIGH (ref 80.0–100.0)
MPV: 8.8 fL (ref 7.5–12.5)
Monocytes Relative: 8.6 %
Neutro Abs: 2062 cells/uL (ref 1500–7800)
Neutrophils Relative %: 50.3 %
Platelets: 237 10*3/uL (ref 140–400)
RBC: 3.88 10*6/uL (ref 3.80–5.10)
RDW: 12 % (ref 11.0–15.0)
Total Lymphocyte: 39.4 %
WBC: 4.1 10*3/uL (ref 3.8–10.8)

## 2019-11-06 LAB — LIPID PANEL
Cholesterol: 160 mg/dL (ref <200)
HDL: 76 mg/dL (ref >=50)
LDL Cholesterol (Calc): 68 mg/dL (calc)
Non-HDL Cholesterol (Calc): 84 mg/dL (calc) (ref <130)
Total CHOL/HDL Ratio: 2.1 (calc) (ref <5.0)
Triglycerides: 75 mg/dL (ref <150)

## 2019-11-06 LAB — COMPREHENSIVE METABOLIC PANEL
AG Ratio: 2 (calc) (ref 1.0–2.5)
ALT: 19 U/L (ref 6–29)
AST: 16 U/L (ref 10–30)
Albumin: 4.5 g/dL (ref 3.6–5.1)
Alkaline phosphatase (APISO): 43 U/L (ref 31–125)
BUN: 10 mg/dL (ref 7–25)
CO2: 26 mmol/L (ref 20–32)
Calcium: 9.2 mg/dL (ref 8.6–10.2)
Chloride: 103 mmol/L (ref 98–110)
Creat: 0.75 mg/dL (ref 0.50–1.10)
Globulin: 2.3 g/dL (calc) (ref 1.9–3.7)
Glucose, Bld: 92 mg/dL (ref 65–99)
Potassium: 4.2 mmol/L (ref 3.5–5.3)
Sodium: 137 mmol/L (ref 135–146)
Total Bilirubin: 0.8 mg/dL (ref 0.2–1.2)
Total Protein: 6.8 g/dL (ref 6.1–8.1)

## 2019-11-06 LAB — HIV ANTIBODY (ROUTINE TESTING W REFLEX): HIV 1&2 Ab, 4th Generation: NONREACTIVE

## 2019-11-06 LAB — RPR: RPR Ser Ql: NONREACTIVE

## 2019-11-07 LAB — PAP IG, CT-NG NAA, HPV HIGH-RISK
C. trachomatis RNA, TMA: NOT DETECTED
HPV DNA High Risk: NOT DETECTED
N. gonorrhoeae RNA, TMA: NOT DETECTED

## 2020-08-09 ENCOUNTER — Ambulatory Visit: Payer: BC Managed Care – PPO | Attending: Internal Medicine

## 2020-08-09 DIAGNOSIS — Z23 Encounter for immunization: Secondary | ICD-10-CM

## 2020-08-09 NOTE — Progress Notes (Signed)
   Covid-19 Vaccination Clinic  Name:  Laurie Duffy    MRN: [id number] DOB: 05-18-1976  08/09/2020  Ms. Laurie Duffy was observed post Covid-19 immunization for 15 minutes without incident. She was provided with Vaccine Information Sheet and instruction to access the V-Safe system.   Ms. Laurie Duffy was instructed to call 911 with any severe reactions post vaccine: Marland Kitchen Difficulty breathing  . Swelling of face and throat  . A fast heartbeat  . A bad rash all over body  . Dizziness and weakness

## 2020-11-10 ENCOUNTER — Encounter: Payer: BC Managed Care – PPO | Admitting: Nurse Practitioner

## 2021-03-28 ENCOUNTER — Other Ambulatory Visit: Payer: Self-pay

## 2021-03-28 ENCOUNTER — Ambulatory Visit (INDEPENDENT_AMBULATORY_CARE_PROVIDER_SITE_OTHER): Payer: BC Managed Care – PPO | Admitting: Nurse Practitioner

## 2021-03-28 ENCOUNTER — Encounter: Payer: Self-pay | Admitting: Nurse Practitioner

## 2021-03-28 VITALS — BP 116/74 | Ht 66.0 in | Wt 172.0 lb

## 2021-03-28 DIAGNOSIS — Z8742 Personal history of other diseases of the female genital tract: Secondary | ICD-10-CM

## 2021-03-28 DIAGNOSIS — Z01419 Encounter for gynecological examination (general) (routine) without abnormal findings: Secondary | ICD-10-CM | POA: Diagnosis not present

## 2021-03-28 NOTE — Progress Notes (Signed)
   Laurie Duffy [birth year] [id number]   History:  45 y.o. G1P0010 presents for annual exam without GYN complaints. Monthly cycles with 1 day of moderate bleeding, minimal dysmenorrhea. 2009 laparoscopy for endometriosis, benign endometrial polyp, and left ovarian endometrioma. Normal pap and mammogram history. Not currently sexually active.   Gynecologic History Patient's last menstrual period was 03/20/2021. Period Cycle (Days): 28 Period Duration (Days): 3 Period Pattern: Regular Menstrual Flow: Heavy Dysmenorrhea: (!) Mild Dysmenorrhea Symptoms: Cramping Contraception/Family planning: abstinence  Health Maintenance Last Pap: 11/05/2019. Results were: Normal Last mammogram: 03/2020 per patient. Results were: Normal Last colonoscopy: Not indicated Last Dexa: Not indicated  Past medical history, past surgical history, family history and social history were all reviewed and documented in the EPIC chart.   ROS:  A ROS was performed and pertinent positives and negatives are included.  Exam:  Vitals:   03/28/21 1530  BP: 116/74  Weight: 172 lb (78 kg)  Height: 5\' 6"  (1.676 m)   Body mass index is 27.76 kg/m.  General appearance:  Normal Thyroid:  Symmetrical, normal in size, without palpable masses or nodularity. Respiratory  Auscultation:  Clear without wheezing or rhonchi Cardiovascular  Auscultation:  Regular rate, without rubs, murmurs or gallops  Edema/varicosities:  Not grossly evident Abdominal  Soft,nontender, without masses, guarding or rebound.  Liver/spleen:  No organomegaly noted  Hernia:  None appreciated  Skin  Inspection:  Grossly normal Breasts: Examined lying and sitting.   Right: Without masses, retractions, nipple discharge or axillary adenopathy.   Left: Without masses, retractions, nipple discharge or axillary adenopathy. Genitourinary   Inguinal/mons:  Normal without inguinal adenopathy  External genitalia:  Normal appearing vulva with no  masses, tenderness, or lesions  BUS/Urethra/Skene's glands:  Normal  Vagina:  Normal appearing with normal color and discharge, no lesions  Cervix:  Normal appearing without discharge or lesions  Uterus:  Normal in size, shape and contour.  Midline and mobile, nontender  Adnexa/parametria:     Rt: Normal in size, without masses or tenderness.   Lt: Normal in size, without masses or tenderness.  Anus and perineum: Normal  Digital rectal exam: Normal sphincter tone without palpated masses or tenderness  Chaperone offered and declined by patient.   Assessment/Plan:  45 y.o. G1P0010 for annual exam.   Well female exam with routine gynecological exam - Plan: CBC with Differential/Platelet, Comprehensive metabolic panel. Education provided on SBEs, importance of preventative screenings, current guidelines, high calcium diet, regular exercise, and multivitamin daily.   History of endometriosis - 2009 laparoscopy. Monthly cycles with 1 day of moderate bleeding, minimal dysmenorrhea.   Screening for cervical cancer - Normal Pap history.  Will repeat at 5-year interval per guidelines.  Screening for breast cancer - Normal mammogram history.  Continue annual screenings.  Normal breast exam today.  Return in 1 year for annual.    Tamela Gammon DNP, 3:48 PM 03/28/2021

## 2021-03-29 LAB — CBC WITH DIFFERENTIAL/PLATELET
Absolute Monocytes: 418 cells/uL (ref 200–950)
Basophils Absolute: 28 cells/uL (ref 0–200)
Basophils Relative: 0.6 %
Eosinophils Absolute: 52 cells/uL (ref 15–500)
Eosinophils Relative: 1.1 %
HCT: 36.5 % (ref 35.0–45.0)
Hemoglobin: 12.2 g/dL (ref 11.7–15.5)
Lymphs Abs: 1631 cells/uL (ref 850–3900)
MCH: 33.8 pg — ABNORMAL HIGH (ref 27.0–33.0)
MCHC: 33.4 g/dL (ref 32.0–36.0)
MCV: 101.1 fL — ABNORMAL HIGH (ref 80.0–100.0)
MPV: 9 fL (ref 7.5–12.5)
Monocytes Relative: 8.9 %
Neutro Abs: 2571 cells/uL (ref 1500–7800)
Neutrophils Relative %: 54.7 %
Platelets: 233 10*3/uL (ref 140–400)
RBC: 3.61 10*6/uL — ABNORMAL LOW (ref 3.80–5.10)
RDW: 12.1 % (ref 11.0–15.0)
Total Lymphocyte: 34.7 %
WBC: 4.7 10*3/uL (ref 3.8–10.8)

## 2021-03-29 LAB — COMPREHENSIVE METABOLIC PANEL
AG Ratio: 1.8 (calc) (ref 1.0–2.5)
ALT: 13 U/L (ref 6–29)
AST: 18 U/L (ref 10–35)
Albumin: 4.2 g/dL (ref 3.6–5.1)
Alkaline phosphatase (APISO): 43 U/L (ref 31–125)
BUN: 12 mg/dL (ref 7–25)
CO2: 27 mmol/L (ref 20–32)
Calcium: 9.4 mg/dL (ref 8.6–10.2)
Chloride: 104 mmol/L (ref 98–110)
Creat: 0.69 mg/dL (ref 0.50–1.10)
Globulin: 2.4 g/dL (calc) (ref 1.9–3.7)
Glucose, Bld: 89 mg/dL (ref 65–99)
Potassium: 4.5 mmol/L (ref 3.5–5.3)
Sodium: 137 mmol/L (ref 135–146)
Total Bilirubin: 0.4 mg/dL (ref 0.2–1.2)
Total Protein: 6.6 g/dL (ref 6.1–8.1)

## 2022-05-25 ENCOUNTER — Other Ambulatory Visit: Payer: Self-pay | Admitting: Podiatry

## 2022-05-25 ENCOUNTER — Ambulatory Visit (INDEPENDENT_AMBULATORY_CARE_PROVIDER_SITE_OTHER): Payer: BC Managed Care – PPO

## 2022-05-25 ENCOUNTER — Ambulatory Visit: Payer: BC Managed Care – PPO | Admitting: Podiatry

## 2022-05-25 DIAGNOSIS — M722 Plantar fascial fibromatosis: Secondary | ICD-10-CM

## 2022-05-25 NOTE — Progress Notes (Signed)
Subjective:  Patient ID: Laurie Duffy, female    DOB: [birth year],  MRN: [id number]  No chief complaint on file.   46 y.o. female presents with the above complaint.  Patient presents with right heel pain/plantar fasciitis pain.  Patient states is swollen throughout the day.  She started getting limb first thing in the morning.  Been on for 6 months is progressive gotten worse.  Pain scale 7 out of 10 hurts with ambulation hurts with pressure.  She has tried stretching as alleviating factor which helped some but does not get rid of it.  She has not seen anyone else prior to seeing me for this.   Review of Systems: Negative except as noted in the HPI. Denies N/V/F/Ch.  Past Medical History:  Diagnosis Date   Asthma    Endometriosis    Hemangioma    Right upper anterior chest wall   History of shingles RIGHT HIP AREA IS DRIED--  JULY 2013    Current Outpatient Medications:    albuterol (PROVENTIL HFA;VENTOLIN HFA) 108 (90 Base) MCG/ACT inhaler, Inhale 2 puffs into the lungs every 6 (six) hours as needed for wheezing or shortness of breath., Disp: , Rfl:    cetirizine (ZYRTEC) 10 MG tablet, Take 10 mg by mouth daily., Disp: , Rfl:    cholecalciferol (VITAMIN D3) 25 MCG (1000 UNIT) tablet, Take 1,000 Units by mouth daily., Disp: , Rfl:    Cranberry 400 MG CAPS, Take by mouth., Disp: , Rfl:    escitalopram (LEXAPRO) 10 MG tablet, Take 10 mg by mouth daily., Disp: , Rfl:    Multiple Vitamin (MULTIVITAMIN) tablet, Take 1 tablet by mouth daily., Disp: , Rfl:    Probiotic Product (PROBIOTIC PO), Take by mouth., Disp: , Rfl:    traZODone (DESYREL) 100 MG tablet, Take 100 mg by mouth at bedtime., Disp: , Rfl:   Social History   Tobacco Use  Smoking Status Never  Smokeless Tobacco Never    Allergies  Allergen Reactions   Cinnamon Hives   Clindamycin/Lincomycin Hives   Other Hives    Peppermint derivatives and cinammon   Peppermint Oil Hives   Objective:  There were no vitals  filed for this visit. There is no height or weight on file to calculate BMI. Constitutional Well developed. Well nourished.  Vascular Dorsalis pedis pulses palpable bilaterally. Posterior tibial pulses palpable bilaterally. Capillary refill normal to all digits.  No cyanosis or clubbing noted. Pedal hair growth normal.  Neurologic Normal speech. Oriented to person, place, and time. Epicritic sensation to light touch grossly present bilaterally.  Dermatologic Nails well groomed and normal in appearance. No open wounds. No skin lesions.  Orthopedic: Normal joint ROM without pain or crepitus bilaterally. No visible deformities. Tender to palpation at the calcaneal tuber right. No pain with calcaneal squeeze right. Ankle ROM diminished range of motion right. Silfverskiold Test: positive right.   Radiographs: Taken and reviewed. No acute fractures or dislocations. No evidence of stress fracture.  Plantar heel spur present. Posterior heel spur present.  Pes planovalgus foot structure noted  Assessment:   1. Plantar fasciitis of right foot    Plan:  Patient was evaluated and treated and all questions answered.  Plantar Fasciitis, right - XR reviewed as above.  - Educated on icing and stretching. Instructions given.  - Injection delivered to the plantar fascia as below. - DME: Plantar fascial brace dispensed to support the medial longitudinal arch of the foot and offload pressure from the heel and prevent  arch collapse during weightbearing - Pharmacologic management: None  Procedure: Injection Tendon/Ligament Location: Right plantar fascia at the glabrous junction; medial approach. Skin Prep: alcohol Injectate: 0.5 cc 0.5% marcaine plain, 0.5 cc of 1% Lidocaine, 0.5 cc kenalog 10. Disposition: Patient tolerated procedure well. Injection site dressed with a band-aid.  No follow-ups on file.

## 2022-06-20 ENCOUNTER — Telehealth: Payer: Self-pay | Admitting: Podiatry

## 2022-06-20 ENCOUNTER — Ambulatory Visit: Payer: BC Managed Care – PPO | Admitting: Podiatry

## 2022-06-20 DIAGNOSIS — M722 Plantar fascial fibromatosis: Secondary | ICD-10-CM | POA: Diagnosis not present

## 2022-06-20 MED ORDER — CYCLOBENZAPRINE HCL 10 MG PO TABS: 10.0000 mg | ORAL_TABLET | Freq: Three times a day (TID) | ORAL | 0 refills | Status: DC | PRN

## 2022-06-20 MED ORDER — CYCLOBENZAPRINE HCL 10 MG PO TABS: 10.0000 mg | ORAL_TABLET | Freq: Three times a day (TID) | ORAL | 0 refills | Status: AC | PRN

## 2022-06-20 NOTE — Progress Notes (Signed)
  Subjective:  Patient ID: Laurie Duffy, female    DOB: [birth year],  MRN: [id number]  Chief Complaint  Patient presents with   Plantar Fasciitis    46 y.o. female presents with the above complaint.  Patient presents with complaint of right Planter fasciitis.  Patient states that the injection did not help much.  She states she still having about the same pain.  She would like to discuss next treatment plan.  She is having some muscle cramping as well   Review of Systems: Negative except as noted in the HPI. Denies N/V/F/Ch.  Past Medical History:  Diagnosis Date   Asthma    Endometriosis    Hemangioma    Right upper anterior chest wall   History of shingles RIGHT HIP AREA IS DRIED--  JULY 2013    Current Outpatient Medications:    cyclobenzaprine (FLEXERIL) 10 MG tablet, Take 1 tablet (10 mg total) by mouth 3 (three) times daily as needed for muscle spasms., Disp: 30 tablet, Rfl: 0   albuterol (PROVENTIL HFA;VENTOLIN HFA) 108 (90 Base) MCG/ACT inhaler, Inhale 2 puffs into the lungs every 6 (six) hours as needed for wheezing or shortness of breath., Disp: , Rfl:    cetirizine (ZYRTEC) 10 MG tablet, Take 10 mg by mouth daily., Disp: , Rfl:    cholecalciferol (VITAMIN D3) 25 MCG (1000 UNIT) tablet, Take 1,000 Units by mouth daily., Disp: , Rfl:    Cranberry 400 MG CAPS, Take by mouth., Disp: , Rfl:    escitalopram (LEXAPRO) 10 MG tablet, Take 10 mg by mouth daily., Disp: , Rfl:    Multiple Vitamin (MULTIVITAMIN) tablet, Take 1 tablet by mouth daily., Disp: , Rfl:    Probiotic Product (PROBIOTIC PO), Take by mouth., Disp: , Rfl:    traZODone (DESYREL) 100 MG tablet, Take 100 mg by mouth at bedtime., Disp: , Rfl:   Social History   Tobacco Use  Smoking Status Never  Smokeless Tobacco Never    Allergies  Allergen Reactions   Cinnamon Hives   Clindamycin/Lincomycin Hives   Other Hives    Peppermint derivatives and cinammon   Peppermint Oil Hives   Objective:  There were  no vitals filed for this visit. There is no height or weight on file to calculate BMI. Constitutional Well developed. Well nourished.  Vascular Dorsalis pedis pulses palpable bilaterally. Posterior tibial pulses palpable bilaterally. Capillary refill normal to all digits.  No cyanosis or clubbing noted. Pedal hair growth normal.  Neurologic Normal speech. Oriented to person, place, and time. Epicritic sensation to light touch grossly present bilaterally.  Dermatologic Nails well groomed and normal in appearance. No open wounds. No skin lesions.  Orthopedic: Normal joint ROM without pain or crepitus bilaterally. No visible deformities. Tender to palpation at the calcaneal tuber right. No pain with calcaneal squeeze right. Ankle ROM diminished range of motion right. Silfverskiold Test: positive right.   Radiographs: Taken and reviewed. No acute fractures or dislocations. No evidence of stress fracture.  Plantar heel spur present. Posterior heel spur present.  Pes planovalgus foot structure noted  Assessment:   1. Plantar fasciitis of right foot     Plan:  Patient was evaluated and treated and all questions answered.  Plantar Fasciitis, right - XR reviewed as above.  - Educated on icing and stretching. Instructions given.  -No further injections that have not helped her - DME: Cam boot immobilization - Pharmacologic management: None -Flexeril was sent for muscle cramping  No follow-ups on file.

## 2022-06-20 NOTE — Telephone Encounter (Signed)
Pt stated she forgot to update her pharmacy location for her Rx to be sent to. [place] [id number].

## 2022-07-18 ENCOUNTER — Ambulatory Visit: Payer: BC Managed Care – PPO | Admitting: Podiatry

## 2022-07-18 DIAGNOSIS — M722 Plantar fascial fibromatosis: Secondary | ICD-10-CM | POA: Diagnosis not present

## 2022-07-18 NOTE — Progress Notes (Signed)
Subjective:  Patient ID: Laurie Duffy, female    DOB: 04/30/1976,  MRN: [id number]  Chief Complaint  Patient presents with   Foot Pain    46 y.o. female presents with the above complaint.  Patient presents for follow-up of right plantar fasciitis.  She states she is doing a lot better when she is in the boot.  She has not tried to take the boot off.  She denies any other acute complaints she would like to discuss next treatment plan l   Review of Systems: Negative except as noted in the HPI. Denies N/V/F/Ch.  Past Medical History:  Diagnosis Date   Asthma    Endometriosis    Hemangioma    Right upper anterior chest wall   History of shingles RIGHT HIP AREA IS DRIED--  JULY 2013    Current Outpatient Medications:    albuterol (PROVENTIL HFA;VENTOLIN HFA) 108 (90 Base) MCG/ACT inhaler, Inhale 2 puffs into the lungs every 6 (six) hours as needed for wheezing or shortness of breath., Disp: , Rfl:    cetirizine (ZYRTEC) 10 MG tablet, Take 10 mg by mouth daily., Disp: , Rfl:    cholecalciferol (VITAMIN D3) 25 MCG (1000 UNIT) tablet, Take 1,000 Units by mouth daily., Disp: , Rfl:    Cranberry 400 MG CAPS, Take by mouth., Disp: , Rfl:    cyclobenzaprine (FLEXERIL) 10 MG tablet, Take 1 tablet (10 mg total) by mouth 3 (three) times daily as needed for muscle spasms., Disp: 30 tablet, Rfl: 0   cyclobenzaprine (FLEXERIL) 10 MG tablet, Take 1 tablet (10 mg total) by mouth 3 (three) times daily as needed for muscle spasms., Disp: 30 tablet, Rfl: 0   escitalopram (LEXAPRO) 10 MG tablet, Take 10 mg by mouth daily., Disp: , Rfl:    Multiple Vitamin (MULTIVITAMIN) tablet, Take 1 tablet by mouth daily., Disp: , Rfl:    Probiotic Product (PROBIOTIC PO), Take by mouth., Disp: , Rfl:    traZODone (DESYREL) 100 MG tablet, Take 100 mg by mouth at bedtime., Disp: , Rfl:   Social History   Tobacco Use  Smoking Status Never  Smokeless Tobacco Never    Allergies  Allergen Reactions   Cinnamon  Hives   Clindamycin/Lincomycin Hives   Other Hives    Peppermint derivatives and cinammon   Peppermint Oil Hives   Objective:  There were no vitals filed for this visit. There is no height or weight on file to calculate BMI. Constitutional Well developed. Well nourished.  Vascular Dorsalis pedis pulses palpable bilaterally. Posterior tibial pulses palpable bilaterally. Capillary refill normal to all digits.  No cyanosis or clubbing noted. Pedal hair growth normal.  Neurologic Normal speech. Oriented to person, place, and time. Epicritic sensation to light touch grossly present bilaterally.  Dermatologic Nails well groomed and normal in appearance. No open wounds. No skin lesions.  Orthopedic: Normal joint ROM without pain or crepitus bilaterally. No visible deformities. Tender to palpation at the calcaneal tuber right. No pain with calcaneal squeeze right. Ankle ROM diminished range of motion right. Silfverskiold Test: positive right.   Radiographs: Taken and reviewed. No acute fractures or dislocations. No evidence of stress fracture.  Plantar heel spur present. Posterior heel spur present.  Pes planovalgus foot structure noted  Assessment:   1. Plantar fasciitis of right foot      Plan:  Patient was evaluated and treated and all questions answered.  Plantar Fasciitis, right -Clinically doing better.  Injections did not help with cam boot immobilization  is helping.  At this time patient will slowly discontinue the boot.  And she will transition to regular shoes.  If she is unable to transition to regular shoes we will discuss further treatment options including PRP injection versus surgical release of the plantar fascial band.  I discussed with patient she states understanding. No follow-ups on file.

## 2022-09-21 ENCOUNTER — Encounter: Payer: Self-pay | Admitting: Nurse Practitioner

## 2022-09-26 ENCOUNTER — Encounter: Payer: Self-pay | Admitting: Nurse Practitioner

## 2022-09-26 ENCOUNTER — Ambulatory Visit (INDEPENDENT_AMBULATORY_CARE_PROVIDER_SITE_OTHER): Payer: BC Managed Care – PPO | Admitting: Nurse Practitioner

## 2022-09-26 VITALS — BP 114/70 | HR 71 | Ht 65.5 in | Wt 190.0 lb

## 2022-09-26 DIAGNOSIS — R1032 Left lower quadrant pain: Secondary | ICD-10-CM

## 2022-09-26 DIAGNOSIS — Z8742 Personal history of other diseases of the female genital tract: Secondary | ICD-10-CM | POA: Diagnosis not present

## 2022-09-26 DIAGNOSIS — Z01419 Encounter for gynecological examination (general) (routine) without abnormal findings: Secondary | ICD-10-CM | POA: Diagnosis not present

## 2022-09-26 NOTE — Progress Notes (Signed)
Laurie Duffy 07/14/1976 [id number]   History:  46 y.o. G1P0010 presents for annual exam. Complains of LLQ pain x 2 months. Describes pain as pressure, intermittent, not related to menses. Feels it is similar to the pain she had with her endometriosis. Monthly cycles, no change in flow or duration. No changes in bowels. She was in boot for a while and thought maybe it was from that but it has not improved. 2009 laparoscopy for endometriosis, benign endometrial polyp, and left ovarian endometrioma. Normal pap and mammogram history.  Gynecologic History Patient's last menstrual period was 08/30/2022 (exact date). Period Duration (Days): 2-3 Period Pattern: Regular Menstrual Flow:  (varies) Menstrual Control: Maxi pad (menstrual underwear) Dysmenorrhea:  (some cramping (more on left side)) Contraception/Family planning: abstinence Sexually active: No  Health Maintenance Last Pap: 11/05/2019. Results were: Normal neg HPV Last mammogram: 09/23/2021. Results were: Normal Last colonoscopy: Not indicated Last Dexa: Not indicated  Past medical history, past surgical history, family history and social history were all reviewed and documented in the EPIC chart.   ROS:  A ROS was performed and pertinent positives and negatives are included.  Exam:  Vitals:   09/26/22 1545  BP: 114/70  Pulse: 71  SpO2: 98%  Weight: 190 lb (86.2 kg)  Height: 5' 5.5" (1.664 m)    Body mass index is 31.14 kg/m.  General appearance:  Normal Thyroid:  Symmetrical, normal in size, without palpable masses or nodularity. Respiratory  Auscultation:  Clear without wheezing or rhonchi Cardiovascular  Auscultation:  Regular rate, without rubs, murmurs or gallops  Edema/varicosities:  Not grossly evident Abdominal  Soft,tenderness left lower to mid quadrant, without masses, guarding or rebound.  Liver/spleen:  No organomegaly noted  Hernia:  None appreciated  Skin  Inspection:  Grossly normal Breasts:  Examined lying and sitting.   Right: Without masses, retractions, nipple discharge or axillary adenopathy.   Left: Without masses, retractions, nipple discharge or axillary adenopathy. Genitourinary   Inguinal/mons:  Normal without inguinal adenopathy  External genitalia:  Normal appearing vulva with no masses, tenderness, or lesions  BUS/Urethra/Skene's glands:  Normal  Vagina:  Normal appearing with normal color and discharge, no lesions  Cervix:  Normal appearing without discharge or lesions  Uterus:  Normal in size, shape and contour.  Midline and mobile, nontender  Adnexa/parametria:     Rt: Normal in size, without masses or tenderness.   Lt: Normal in size, without masses or tenderness.  Anus and perineum: Normal  Digital rectal exam: Normal sphincter tone without palpated masses or tenderness  Patient informed chaperone available to be present for breast and pelvic exam. Patient has requested no chaperone to be present. Patient has been advised what will be completed during breast and pelvic exam.   Assessment/Plan:  46 y.o. G1P0010 for annual exam.   Well female exam with routine gynecological exam - Plan: CBC with Differential/Platelet, Comprehensive metabolic panel, Lipid panel. Education provided on SBEs, importance of preventative screenings, current guidelines, high calcium diet, regular exercise, and multivitamin daily.   Left lower quadrant pain - Plan: US PELVIS TRANSVAGINAL NON-OB (TV ONLY). Differential diagnosis include ovarian cyst, endometriosis, diverticulitis, or musculoskeletal.   History of endometriosis - Plan: US PELVIS TRANSVAGINAL NON-OB (TV ONLY)  Screening for cervical cancer - Normal Pap history.  Will repeat at 5-year interval per guidelines.  Screening for breast cancer - Normal mammogram history.  Continue annual screenings.  Normal breast exam today.  Screening for colon cancer - Has not had colon cancer  screening. Discussed current guidelines and  importance of preventative screenings. Colonoscopy versus Cologuard discussed. Information provided on [facility].    Return in 1 year for annual.    Tamela Gammon DNP, 4:29 PM 09/26/2022

## 2022-09-27 LAB — LIPID PANEL
Cholesterol: 174 mg/dL (ref <200)
HDL: 65 mg/dL (ref >=50)
LDL Cholesterol (Calc): 84 mg/dL (calc)
Non-HDL Cholesterol (Calc): 109 mg/dL (calc) (ref <130)
Total CHOL/HDL Ratio: 2.7 (calc) (ref <5.0)
Triglycerides: 146 mg/dL (ref <150)

## 2022-09-27 LAB — COMPREHENSIVE METABOLIC PANEL
AG Ratio: 1.9 (calc) (ref 1.0–2.5)
ALT: 13 U/L (ref 6–29)
AST: 13 U/L (ref 10–35)
Albumin: 4.5 g/dL (ref 3.6–5.1)
Alkaline phosphatase (APISO): 65 U/L (ref 31–125)
BUN: 15 mg/dL (ref 7–25)
CO2: 25 mmol/L (ref 20–32)
Calcium: 9.4 mg/dL (ref 8.6–10.2)
Chloride: 102 mmol/L (ref 98–110)
Creat: 0.77 mg/dL (ref 0.50–0.99)
Globulin: 2.4 g/dL (calc) (ref 1.9–3.7)
Glucose, Bld: 95 mg/dL (ref 65–99)
Potassium: 4.9 mmol/L (ref 3.5–5.3)
Sodium: 137 mmol/L (ref 135–146)
Total Bilirubin: 0.3 mg/dL (ref 0.2–1.2)
Total Protein: 6.9 g/dL (ref 6.1–8.1)

## 2022-09-27 LAB — CBC WITH DIFFERENTIAL/PLATELET
Absolute Monocytes: 488 cells/uL (ref 200–950)
Basophils Absolute: 42 cells/uL (ref 0–200)
Basophils Relative: 0.8 %
Eosinophils Absolute: 90 cells/uL (ref 15–500)
Eosinophils Relative: 1.7 %
HCT: 39.8 % (ref 35.0–45.0)
Hemoglobin: 13.6 g/dL (ref 11.7–15.5)
Lymphs Abs: 2157 cells/uL (ref 850–3900)
MCH: 34.4 pg — ABNORMAL HIGH (ref 27.0–33.0)
MCHC: 34.2 g/dL (ref 32.0–36.0)
MCV: 100.8 fL — ABNORMAL HIGH (ref 80.0–100.0)
MPV: 9.1 fL (ref 7.5–12.5)
Monocytes Relative: 9.2 %
Neutro Abs: 2523 cells/uL (ref 1500–7800)
Neutrophils Relative %: 47.6 %
Platelets: 286 10*3/uL (ref 140–400)
RBC: 3.95 10*6/uL (ref 3.80–5.10)
RDW: 12.1 % (ref 11.0–15.0)
Total Lymphocyte: 40.7 %
WBC: 5.3 10*3/uL (ref 3.8–10.8)

## 2022-09-28 ENCOUNTER — Ambulatory Visit: Payer: BC Managed Care – PPO | Admitting: Nurse Practitioner

## 2022-10-19 ENCOUNTER — Ambulatory Visit (INDEPENDENT_AMBULATORY_CARE_PROVIDER_SITE_OTHER): Payer: BC Managed Care – PPO

## 2022-10-19 ENCOUNTER — Ambulatory Visit (INDEPENDENT_AMBULATORY_CARE_PROVIDER_SITE_OTHER): Payer: BC Managed Care – PPO | Admitting: Nurse Practitioner

## 2022-10-19 DIAGNOSIS — Z8742 Personal history of other diseases of the female genital tract: Secondary | ICD-10-CM | POA: Diagnosis not present

## 2022-10-19 DIAGNOSIS — R1032 Left lower quadrant pain: Secondary | ICD-10-CM | POA: Diagnosis not present

## 2022-10-19 NOTE — Progress Notes (Signed)
   Acute Office Visit  Subjective:    Patient ID: Laurie Duffy, female    DOB: [birth year], 47 y.o.   MRN: [id number]   HPI 47 y.o. presents today for ultrasound. Seen 09/26/22 with complaints of LLQ pain x 2 months. Describes pain as pressure, intermittent, not related to menses. Pain is lateral and along side. Feels it is similar to the pain she had with her endometriosis years ago. She was told she had "webbing" around colon during her 2009 excision of endometriosis and that she would likely need surgery for this later.  Monthly cycles, no change in flow or duration. No changes in bowels. She was in boot for a while and thought maybe it was from that but it has not improved. 2009 laparoscopy for endometriosis, benign endometrial polyp, and left ovarian endometrioma. Normal pap history, most recent 10/2019. Pain has not changed since last visit.    Review of Systems  Constitutional: Negative.   Gastrointestinal:  Positive for abdominal pain (LLQ). Negative for abdominal distention, constipation, diarrhea, nausea and vomiting.  Genitourinary:  Positive for pelvic pain. Negative for menstrual problem.       Objective:    Physical Exam Constitutional:      Appearance: Normal appearance.   GI:  Not indicated (no changes) GU: Not indicated (no changes)  LMP 08/30/2022 (Exact Date)  Wt Readings from Last 3 Encounters:  09/26/22 190 lb (86.2 kg)  03/28/21 172 lb (78 kg)  11/05/19 143 lb 9.6 oz (65.1 kg)        Assessment & Plan:   Problem List Items Addressed This Visit   None Visit Diagnoses     Left lower quadrant pain    -  Primary      Vaginal ultrasound: Anteverted uterus, normal size and shape, small subcentimeter intramural fibroids noted.  Vaginal cuff shows several small subcentimeter simple cystic areas.  Thin, symmetrical endometrium.  No masses or thickening seen.  Right ovary within normal limits with positive perfusion, mobile.  Left ovary 17 mm resolving  cyst.  No adnexal masses, no free fluid.  Plan: Ultrasound reviewed with patient, reassurance on findings. We did again discuss nature of endometriosis, not typically seen on U/S. Will follow up with PCP. If GI and MSK causes ruled out we discussed management of suspected endometriosis with COCs, Freida Busman, or laparoscopic procedure.      Tamela Gammon DNP, 12:03 PM 10/19/2022

## 2022-11-14 ENCOUNTER — Telehealth: Payer: Self-pay

## 2022-11-14 NOTE — Telephone Encounter (Signed)
Pt LVM calling to report just being d/c from ER due to pain and had some imaging done.   Was seen here w/ [place] on 10/19/22 due to LLQ pains and was advised to follow back up if MSK and GI causes were ruled out to discuss mgmt of suspected endometriosis.   [place] of Abdomen done 11/06/22 ([facility] everywhere)  CT done 11/14/22 (Care everywhere)   LVM on pt's answering machine advising her that we will return with recommendation(s) tomorrow and to follow any instructions that the hospital may have given her as far as for pain mgmt.

## 2022-11-15 ENCOUNTER — Other Ambulatory Visit: Payer: Self-pay | Admitting: Nurse Practitioner

## 2022-11-15 ENCOUNTER — Encounter: Payer: Self-pay | Admitting: Nurse Practitioner

## 2022-11-15 DIAGNOSIS — R1012 Left upper quadrant pain: Secondary | ICD-10-CM

## 2022-11-15 DIAGNOSIS — R1032 Left lower quadrant pain: Secondary | ICD-10-CM

## 2022-11-15 NOTE — Telephone Encounter (Signed)
Imaging and ER visit reviewed. Based on history of endometriosis, clinical presentation and negative workup, I recommend she see Dr. Dellis Filbert to discuss laparoscopic surgery for possible endometriosis.

## 2022-11-15 NOTE — Telephone Encounter (Signed)
Pt notified and voiced understanding. However, had an additional question re: if she should consider maybe seeing a GI doc about a possible endoscopy first due to experiencing pains internally with palpation of area surrounding spleen and was advised that the imaging she had done couldn't really see that area too well. And was told in the past by Dr. Phineas Real that future lap for her endometriosis again could potentially get risky. Please advise. Thanks.

## 2022-11-15 NOTE — Telephone Encounter (Signed)
FYI. Pt notified and voiced understanding. Would like to go the GI route first. Pt states she knows some people in [facility] and [place] locations and will find [place] a location/provider to send referral to and will send [place] info via mychart asap.

## 2022-11-15 NOTE — Telephone Encounter (Signed)
Yes, she can be evaluated by GI if she prefers. CT scan report did not mention difficulty visualizing spleen and endoscopy would not look at spleen. We had also discussed trying COCs or [place] for management of endometriosis since surgery could be difficult. Is this something she would still consider?

## 2022-11-17 NOTE — Telephone Encounter (Signed)
Referral faxed successfully. Pt notified and voiced understanding.

## 2022-11-17 NOTE — Telephone Encounter (Signed)
Pt also LVM in triage line.

## 2022-11-21 NOTE — Telephone Encounter (Signed)
Refaxed referral to new number patient gave and informed patient it went through.

## 2022-11-21 NOTE — Telephone Encounter (Signed)
Routing to [employer].   Encounter closed.

## 2022-11-21 NOTE — Telephone Encounter (Signed)
Refer to [place] from 11/15/2022. Will close encounter.

## 2022-11-22 NOTE — Telephone Encounter (Signed)
Tiffany,  We had originally sent the New Patient Referral/Consultation form.  Please see patient's message.  I called [facility] and they sent me the GI procedures order form. It is going to be on your desk for you to complete to order procedure.  Thanks

## 2022-11-22 NOTE — Telephone Encounter (Signed)
Completed and given to [facility]. Thanks.

## 2023-05-07 ENCOUNTER — Ambulatory Visit: Payer: Self-pay | Admitting: Internal Medicine

## 2023-07-04 ENCOUNTER — Ambulatory Visit: Payer: Self-pay | Admitting: Internal Medicine
